# Patient Record
Sex: Female | Born: 1975 | Race: Black or African American | Hispanic: No | Marital: Single | Smoking: Never smoker
Health system: Southern US, Community
[De-identification: ages and names within clinical notes are randomized; demographics above are authoritative.]

## PROBLEM LIST (undated history)

## (undated) DIAGNOSIS — I10 Essential (primary) hypertension: Secondary | ICD-10-CM

## (undated) DIAGNOSIS — K219 Gastro-esophageal reflux disease without esophagitis: Secondary | ICD-10-CM

## (undated) DIAGNOSIS — E7800 Pure hypercholesterolemia, unspecified: Secondary | ICD-10-CM

## (undated) DIAGNOSIS — E119 Type 2 diabetes mellitus without complications: Secondary | ICD-10-CM

## (undated) DIAGNOSIS — M549 Dorsalgia, unspecified: Secondary | ICD-10-CM

## (undated) DIAGNOSIS — G8929 Other chronic pain: Secondary | ICD-10-CM

## (undated) HISTORY — PX: TONSILLECTOMY: SUR1361

## (undated) HISTORY — PX: ABDOMINAL HYSTERECTOMY: SHX81

## (undated) HISTORY — PX: CHOLECYSTECTOMY: SHX55

---

## 2013-04-16 ENCOUNTER — Emergency Department (HOSPITAL_COMMUNITY)
Admission: EM | Admit: 2013-04-16 | Discharge: 2013-04-16 | Disposition: A | Discharge status: Home / Self Care | Payer: Medicaid Other | Attending: Emergency Medicine | Admitting: Emergency Medicine

## 2013-04-16 ENCOUNTER — Encounter (HOSPITAL_COMMUNITY): Payer: Self-pay | Admitting: Emergency Medicine

## 2013-04-16 DIAGNOSIS — K3184 Gastroparesis: Secondary | ICD-10-CM | POA: Insufficient documentation

## 2013-04-16 DIAGNOSIS — G8929 Other chronic pain: Secondary | ICD-10-CM | POA: Insufficient documentation

## 2013-04-16 DIAGNOSIS — M545 Low back pain, unspecified: Secondary | ICD-10-CM | POA: Insufficient documentation

## 2013-04-16 DIAGNOSIS — E7800 Pure hypercholesterolemia, unspecified: Secondary | ICD-10-CM | POA: Insufficient documentation

## 2013-04-16 DIAGNOSIS — R109 Unspecified abdominal pain: Secondary | ICD-10-CM | POA: Insufficient documentation

## 2013-04-16 DIAGNOSIS — H9209 Otalgia, unspecified ear: Secondary | ICD-10-CM | POA: Insufficient documentation

## 2013-04-16 DIAGNOSIS — I10 Essential (primary) hypertension: Secondary | ICD-10-CM | POA: Insufficient documentation

## 2013-04-16 DIAGNOSIS — Z794 Long term (current) use of insulin: Secondary | ICD-10-CM | POA: Insufficient documentation

## 2013-04-16 DIAGNOSIS — K219 Gastro-esophageal reflux disease without esophagitis: Secondary | ICD-10-CM | POA: Insufficient documentation

## 2013-04-16 DIAGNOSIS — E119 Type 2 diabetes mellitus without complications: Secondary | ICD-10-CM | POA: Insufficient documentation

## 2013-04-16 DIAGNOSIS — M549 Dorsalgia, unspecified: Secondary | ICD-10-CM

## 2013-04-16 DIAGNOSIS — R110 Nausea: Secondary | ICD-10-CM | POA: Insufficient documentation

## 2013-04-16 HISTORY — DX: Type 2 diabetes mellitus without complications: E11.9

## 2013-04-16 HISTORY — DX: Dorsalgia, unspecified: M54.9

## 2013-04-16 HISTORY — DX: Other chronic pain: G89.29

## 2013-04-16 HISTORY — DX: Gastro-esophageal reflux disease without esophagitis: K21.9

## 2013-04-16 HISTORY — DX: Pure hypercholesterolemia, unspecified: E78.00

## 2013-04-16 HISTORY — DX: Essential (primary) hypertension: I10

## 2013-04-16 MED ORDER — DIPHENHYDRAMINE HCL 50 MG/ML IJ SOLN
25.0000 mg | Freq: Once | INTRAMUSCULAR | Status: AC
  Administered 2013-04-16: 25 mg via INTRAVENOUS
  Filled 2013-04-16: qty 1

## 2013-04-16 MED ORDER — LORAZEPAM 2 MG/ML IJ SOLN
1.0000 mg | Freq: Once | INTRAMUSCULAR | Status: AC
  Administered 2013-04-16: 1 mg via INTRAVENOUS
  Filled 2013-04-16: qty 1

## 2013-04-16 MED ORDER — SODIUM CHLORIDE 0.9 % IV SOLN
1000.0000 mL | INTRAVENOUS | Status: DC
  Administered 2013-04-16: 1000 mL via INTRAVENOUS

## 2013-04-16 MED ORDER — ONDANSETRON HCL 4 MG PO TABS: 4.0000 mg | ORAL_TABLET | Freq: Three times a day (TID) | ORAL | Status: AC | PRN

## 2013-04-16 MED ORDER — PREDNISONE 20 MG PO TABS
ORAL_TABLET | ORAL | Status: DC
Start: 2013-04-16 — End: 2015-05-09

## 2013-04-16 MED ORDER — METHOCARBAMOL 500 MG PO TABS: ORAL_TABLET | ORAL | Status: DC

## 2013-04-16 MED ORDER — FENTANYL CITRATE 0.05 MG/ML IJ SOLN
50.0000 ug | Freq: Once | INTRAMUSCULAR | Status: AC
  Administered 2013-04-16: 50 ug via INTRAVENOUS
  Filled 2013-04-16: qty 2

## 2013-04-16 MED ORDER — TRAMADOL HCL 50 MG PO TABS: 100.0000 mg | ORAL_TABLET | Freq: Four times a day (QID) | ORAL | Status: DC | PRN

## 2013-04-16 MED ORDER — METOCLOPRAMIDE HCL 10 MG PO TABS: 10.0000 mg | ORAL_TABLET | Freq: Four times a day (QID) | ORAL | Status: DC

## 2013-04-16 MED ORDER — KETOROLAC TROMETHAMINE 30 MG/ML IJ SOLN
30.0000 mg | Freq: Once | INTRAMUSCULAR | Status: AC
  Administered 2013-04-16: 30 mg via INTRAVENOUS
  Filled 2013-04-16: qty 1

## 2013-04-16 MED ORDER — METOCLOPRAMIDE HCL 5 MG/ML IJ SOLN
10.0000 mg | Freq: Once | INTRAMUSCULAR | Status: AC
  Administered 2013-04-16: 10 mg via INTRAVENOUS
  Filled 2013-04-16: qty 2

## 2013-04-16 MED ORDER — SODIUM CHLORIDE 0.9 % IV SOLN
1000.0000 mL | Freq: Once | INTRAVENOUS | Status: AC
Start: 2013-04-16 — End: 2013-04-16
  Administered 2013-04-16: 1000 mL via INTRAVENOUS

## 2013-04-16 MED ORDER — METHYLPREDNISOLONE SODIUM SUCC 125 MG IJ SOLR
125.0000 mg | Freq: Once | INTRAMUSCULAR | Status: AC
  Administered 2013-04-16: 125 mg via INTRAVENOUS
  Filled 2013-04-16: qty 2

## 2013-04-16 NOTE — ED Provider Notes (Addendum)
CSN: [id number]     Arrival date & time 04/16/13  4098 History   First MD Initiated Contact with Patient 04/16/13 [phone]     Chief Complaint  Patient presents with  . Back Pain  . Nausea  . Otalgia   (Consider location/radiation/quality/duration/timing/severity/associated sxs/prior Treatment) HPI Patient is here several different problems. She reports she has had back pain for the past 6 months. About one month ago she started seeing Dr. Marissa Nestle and has had a MRI showing a "slipped disc". She states he started her on medications and recommended outpatient physical therapy. She reports she has been unable to get the physical therapy done because she can't find somebody to take Medicaid. She states she was not given a list of facilities to call. She reports about 4 days ago her back started getting worse. She denies any specific injury 4 days ago. She called the office yesterday and they told her to followup with a physical therapist. She describes pain in her whole back from her neck down to her tailbone. She states the worst pain is in the upper back. She states her bilateral feet feel numb when she first gets up and then she wiggles them around and he gets better. She states laying down and walking for a period of time makes the pain worse. She states laying down and using heat or massage makes it feel better. She states her father is in our hospital having surgery so she came to the ED.   She also reports some chronic abdominal pain for which she had a hysterectomy done in June of 2014. She was told she had bad fibroids and her pain would get better after her hysterectomy. She reports however the pain is not improved. She has seen a GI doctor and is waiting for a referral at [facility] but states she's been waiting several months. She states she was told her  stomach empties slowly. She states her pain is diffuse. It's sharp it comes and goes and lasts about an hour. She denies dysuria but has frequency  now and then. She denies any fever. Nothing makes her abdominal pain worse, nothing makes it feel better.  PCP [facility] in [place] [facility] Dr Yevette Edwards  Past Medical History  Diagnosis Date  . Diabetes mellitus without complication   . Hypertension   . Hypercholesteremia   . GERD (gastroesophageal reflux disease)   . Chronic back pain    Past Surgical History  Procedure Laterality Date  . Abdominal hysterectomy    . Cholecystectomy    . Tonsillectomy     No family history on file. History  Substance Use Topics  . Smoking status: Never Smoker   . Smokeless tobacco: Not on file  . Alcohol Use: No   Unable to work for the past year  OB History   Grav Para Term Preterm Abortions TAB SAB Ect Mult Living                 Review of Systems  All other systems reviewed and are negative.    Allergies  Review of patient's allergies indicates no known allergies.  Home Medications   Current Outpatient Rx  Name  Route  Sig  Dispense  Refill  . insulin detemir (LEVEMIR) 100 UNIT/ML injection   Subcutaneous   Inject 30 Units into the skin daily.         . pantoprazole (PROTONIX) 40 MG tablet   Oral   Take 40 mg by mouth daily.         Marland Kitchen  simvastatin (ZOCOR) 20 MG tablet   Oral   Take 20 mg by mouth daily.          Flexeril mobic   BP 134/89  Pulse 98  Temp(Src) 97.5 F (36.4 C) (Oral)  Resp 20  SpO2 99%  Vital signs normal   Physical Exam  Nursing note and vitals reviewed. Constitutional: She is oriented to person, place, and time. She appears well-developed and well-nourished.  Non-toxic appearance. She does not appear ill. No distress.  Obese, crying  HENT:  Head: Normocephalic and atraumatic.  Right Ear: External ear normal.  Left Ear: External ear normal.  Nose: Nose normal. No mucosal edema or rhinorrhea.  Mouth/Throat: Oropharynx is clear and moist and mucous membranes are normal. No dental abscesses or uvula swelling.  Eyes:  Conjunctivae and EOM are normal. Pupils are equal, round, and reactive to light.  Neck: Normal range of motion and full passive range of motion without pain. Neck supple.  Cardiovascular: Normal rate, regular rhythm and normal heart sounds.  Exam reveals no gallop and no friction rub.   No murmur heard. Pulmonary/Chest: Effort normal and breath sounds normal. No respiratory distress. She has no wheezes. She has no rhonchi. She has no rales. She exhibits no tenderness and no crepitus.  Abdominal: Soft. Normal appearance and bowel sounds are normal. She exhibits no distension. There is no tenderness. There is no rebound and no guarding.  Musculoskeletal: Normal range of motion. She exhibits no edema and no tenderness.       Back:  Moves all extremities well. Gail normal. Patellar relexes +2 on the left +1 on the left. Has pain with ROM especially to the right but it causes pain in her upper thoracic spine.   Neurological: She is alert and oriented to person, place, and time. She has normal strength. No cranial nerve deficit.  Skin: Skin is warm, dry and intact. No rash noted. No erythema. No pallor.  Psychiatric: She has a normal mood and affect. Her speech is normal and behavior is normal. Her mood appears not anxious.    ED Course  Procedures (including critical care time)  Medications  0.9 %  sodium chloride infusion (0 mLs Intravenous Stopped 04/16/13 1153)    Followed by  0.9 %  sodium chloride infusion (0 mLs Intravenous Stopped 04/16/13 1325)  fentaNYL (SUBLIMAZE) injection 50 mcg (not administered)  LORazepam (ATIVAN) injection 1 mg (not administered)  metoCLOPramide (REGLAN) injection 10 mg (10 mg Intravenous Given 04/16/13 1057)  diphenhydrAMINE (BENADRYL) injection 25 mg (25 mg Intravenous Given 04/16/13 1101)  ketorolac (TORADOL) 30 MG/ML injection 30 mg (30 mg Intravenous Given 04/16/13 1058)  methylPREDNISolone sodium succinate (SOLU-MEDROL) 125 mg/2 mL injection 125 mg (125 mg  Intravenous Given 04/16/13 1055)  diphenhydrAMINE (BENADRYL) injection 25 mg (25 mg Intravenous Given 04/16/13 1245)      10:45 Social worker contacted to help get patient outpatient physical therapy resources.   Review of [place] shows patient had a gastric emptying test done in October 2014 which showed delayed emptying of her stomach. She had abd/pelvis CT scan done in November 2014 that did not show any acute findings. She had a MRI of her lumbar spine done on 03/10/2013. She was found to have a slightly transitional appearance of the L5 vertebral body. She had a L4-5 bulge which is shallow right posterior lateral slightly caudal extending protrusion with mild impression right aspect of the thecal sac and minimal crowding of the origin of the right L5 nerve  root. Mild facet joint degenerative changes were seen.  Pt given results of her prior studies and need to see her doctor this week to get the GI referral done [facility] (states has been waiting since September).  [place] Database reviewed. She has had a prescription since August. They are from  [place]icians [place] from [place] and [place]. Her last prescription was November 5 for #6 Vicodin. Her own PCP prescribed #60 tramadol in October. She has been on Vicodin, pentazocine/naloxone, oxycodone.  MRI not done today. Patient's worst pain is in her upper thoracic area rather than her lower back. She also has diffuse nonlocalizing pain of her whole back including the spine and paraspinous muscles. CT scan not done because this is patient's usual chronic pain.   No results found.   Imaging Review No results found.  EKG Interpretation   None       MDM   1. Chronic back pain greater than 3 months duration   2. Chronic abdominal pain   3. Gastroparesis    New Prescriptions   METHOCARBAMOL (ROBAXIN) 500 MG TABLET    Take 2 po QID   METOCLOPRAMIDE (REGLAN) 10 MG TABLET    Take 1 tablet (10 mg total) by mouth every 6 (six) hours.    ONDANSETRON (ZOFRAN) 4 MG TABLET    Take 1 tablet (4 mg total) by mouth every 8 (eight) hours as needed for nausea or vomiting.   PREDNISONE (DELTASONE) 20 MG TABLET    Take 3 po QD x 2d , then 2 po QD x 3d then 1 po QD x 3d   TRAMADOL (ULTRAM) 50 MG TABLET    Take 2 tablets (100 mg total) by mouth every 6 (six) hours as needed.    Plan discharge   Devoria AlbeIva Keziyah Kneale, MD, [facility]     Jasneet Schobert L Zeno Hickel, MD 04/16/13 1425  Ward GivensIva L Teala Daffron, MD 04/16/13 [phone]

## 2013-04-16 NOTE — Progress Notes (Signed)
Weekend CSW received referral for listing of medicaid physical therapy providers. CSW contacted Manuela Schwartz from PT, who did not have a listing of outpatient physical therapy providers, but stated that [facility] may accept Medicaid patients. CSW called [facility], but the office is closed until Monday. CSW also looked online for physical therapists in [place], where patient lives, that accept Medicaid insurance- no results. CSW met with patient and provided her with a listing of physical therapists in [place] to contact to inquire if they accept Medicaid, and also provided patient with brochure on [facility] services to follow up with. Patient did not have any further questions at this time.  Tilden Fossa, MSW, [place] Clinical Social Worker [facility] Dept. [phone]

## 2013-04-16 NOTE — ED Notes (Signed)
Pt reporting chronic back pain worse for 4 days. Also vomiting x 4 days and left ear pain. Pt is a x 4. Denies urinary s/s. No diarrhea.

## 2013-04-16 NOTE — Discharge Instructions (Signed)
Try ice and heat to your back. Take the medications as prescribed. Take the tramadol with acetaminophen 1000 mg 4 times a day. You need to see Dr Milas Gain this week to discuss your further evaluation by the stomach specialist. Call the physical therapy facilities given to you by the social worker to see about getting into physical therapy for your back. Start taking the reglan again, it will help your stomach empty better and should help with your pain.    Chronic Pain Discharge Instructions  Emergency care providers appreciate that many patients coming to [place] are in severe pain and we wish to address their pain in the safest, most responsible manner.  It is important to recognize however, that the proper treatment of chronic pain differs from that of the pain of injuries and acute illnesses.  Our goal is to provide quality, safe, personalized care and we thank you for giving [place] the opportunity to serve you. The use of narcotics and related agents for chronic pain syndromes may lead to additional physical and psychological problems.  Nearly as many people die from prescription narcotics each year as die from car crashes.  Additionally, this risk is increased if such prescriptions are obtained from a variety of sources.  Therefore, only your primary care physician or a pain management specialist is able to safely treat such syndromes with narcotic medications long-term.    Documentation revealing such prescriptions have been sought from multiple sources may prohibit [place] from providing a refill or different narcotic medication.  Your name may be checked first through the [facility] Controlled Substances Reporting System.  This database is a record of controlled substance medication prescriptions that the patient has received.  This has been established by [facility] in an effort to eliminate the dangerous, and often life threatening, practice of obtaining multiple prescriptions from different medical  providers.   If you have a chronic pain syndrome (i.e. chronic headaches, recurrent back or neck pain, dental pain, abdominal or pelvis pain without a specific diagnosis, or neuropathic pain such as fibromyalgia) or recurrent visits for the same condition without an acute diagnosis, you may be treated with non-narcotics and other non-addictive medicines.  Allergic reactions or negative side effects that may be reported by a patient to such medications will not typically lead to the use of a narcotic analgesic or other controlled substance as an alternative.   Patients managing chronic pain with a personal physician should have provisions in place for breakthrough pain.  If you are in crisis, you should call your physician.  If your physician directs you to the emergency department, please have the doctor call and speak to our attending physician concerning your care.   When patients come to the Emergency Department (ED) with acute medical conditions in which the Emergency Department physician feels appropriate to prescribe narcotic or sedating pain medication, the physician will prescribe these in very limited quantities.  The amount of these medications will last only until you can see your primary care physician in his/her office.  Any patient who returns to the ED seeking refills should expect only non-narcotic pain medications.   In the event of an acute medical condition exists and the emergency physician feels it is necessary that the patient be given a narcotic or sedating medication -  a responsible adult driver should be present in the room prior to the medication being given by the nurse.   Prescriptions for narcotic or sedating medications that have been lost, stolen or  expired will not be refilled in the Emergency Department.    Patients who have chronic pain may receive non-narcotic prescriptions until seen by their primary care physician.  It is every patients personal responsibility to  maintain active prescriptions with his or her primary care physician or specialist.

## 2015-05-09 ENCOUNTER — Inpatient Hospital Stay (HOSPITAL_COMMUNITY)
Admission: EM | Admit: 2015-05-09 | Discharge: 2015-05-14 | DRG: 287 | Disposition: A | Discharge status: Home / Self Care | Payer: Medicaid Other | Attending: Cardiology | Admitting: Cardiology

## 2015-05-09 ENCOUNTER — Encounter (HOSPITAL_COMMUNITY): Payer: Self-pay | Admitting: Family Medicine

## 2015-05-09 ENCOUNTER — Emergency Department (HOSPITAL_COMMUNITY): Payer: Medicaid Other

## 2015-05-09 DIAGNOSIS — E119 Type 2 diabetes mellitus without complications: Secondary | ICD-10-CM | POA: Diagnosis present

## 2015-05-09 DIAGNOSIS — R079 Chest pain, unspecified: Secondary | ICD-10-CM | POA: Diagnosis present

## 2015-05-09 DIAGNOSIS — Z23 Encounter for immunization: Secondary | ICD-10-CM

## 2015-05-09 DIAGNOSIS — Z6841 Body Mass Index (BMI) 40.0 and over, adult: Secondary | ICD-10-CM

## 2015-05-09 DIAGNOSIS — Z7901 Long term (current) use of anticoagulants: Secondary | ICD-10-CM

## 2015-05-09 DIAGNOSIS — Z794 Long term (current) use of insulin: Secondary | ICD-10-CM

## 2015-05-09 DIAGNOSIS — R0602 Shortness of breath: Secondary | ICD-10-CM

## 2015-05-09 DIAGNOSIS — Z8249 Family history of ischemic heart disease and other diseases of the circulatory system: Secondary | ICD-10-CM

## 2015-05-09 DIAGNOSIS — R072 Precordial pain: Principal | ICD-10-CM | POA: Diagnosis present

## 2015-05-09 DIAGNOSIS — Z86711 Personal history of pulmonary embolism: Secondary | ICD-10-CM

## 2015-05-09 DIAGNOSIS — K219 Gastro-esophageal reflux disease without esophagitis: Secondary | ICD-10-CM | POA: Diagnosis present

## 2015-05-09 DIAGNOSIS — R109 Unspecified abdominal pain: Secondary | ICD-10-CM

## 2015-05-09 DIAGNOSIS — E7800 Pure hypercholesterolemia, unspecified: Secondary | ICD-10-CM | POA: Diagnosis present

## 2015-05-09 DIAGNOSIS — I10 Essential (primary) hypertension: Secondary | ICD-10-CM | POA: Diagnosis present

## 2015-05-09 DIAGNOSIS — G8929 Other chronic pain: Secondary | ICD-10-CM | POA: Diagnosis present

## 2015-05-09 DIAGNOSIS — M549 Dorsalgia, unspecified: Secondary | ICD-10-CM | POA: Diagnosis present

## 2015-05-09 DIAGNOSIS — R1084 Generalized abdominal pain: Secondary | ICD-10-CM | POA: Diagnosis present

## 2015-05-09 DIAGNOSIS — R931 Abnormal findings on diagnostic imaging of heart and coronary circulation: Secondary | ICD-10-CM | POA: Diagnosis present

## 2015-05-09 DIAGNOSIS — R112 Nausea with vomiting, unspecified: Secondary | ICD-10-CM | POA: Diagnosis present

## 2015-05-09 LAB — COMPREHENSIVE METABOLIC PANEL
ALBUMIN: 3.6 g/dL (ref 3.5–5.0)
ALK PHOS: 68 U/L (ref 38–126)
ALT: 22 U/L (ref 14–54)
AST: 15 U/L (ref 15–41)
Anion gap: 13 (ref 5–15)
BILIRUBIN TOTAL: 1 mg/dL (ref 0.3–1.2)
BUN: 9 mg/dL (ref 6–20)
CALCIUM: 9.5 mg/dL (ref 8.9–10.3)
CO2: 27 mmol/L (ref 22–32)
CREATININE: 0.66 mg/dL (ref 0.44–1.00)
Chloride: 100 mmol/L — ABNORMAL LOW (ref 101–111)
GFR calc Af Amer: >60 mL/min (ref >60)
GFR calc non Af Amer: >60 mL/min (ref >60)
GLUCOSE: 214 mg/dL — AB (ref 65–99)
Potassium: 4 mmol/L (ref 3.5–5.1)
Sodium: 140 mmol/L (ref 135–145)
TOTAL PROTEIN: 7.2 g/dL (ref 6.5–8.1)

## 2015-05-09 LAB — BASIC METABOLIC PANEL
ANION GAP: 15 (ref 5–15)
BUN: 10 mg/dL (ref 6–20)
CALCIUM: 10 mg/dL (ref 8.9–10.3)
CO2: 27 mmol/L (ref 22–32)
Chloride: 96 mmol/L — ABNORMAL LOW (ref 101–111)
Creatinine, Ser: 0.74 mg/dL (ref 0.44–1.00)
GFR calc Af Amer: >60 mL/min (ref >60)
Glucose, Bld: 250 mg/dL — ABNORMAL HIGH (ref 65–99)
Potassium: 4.2 mmol/L (ref 3.5–5.1)
Sodium: 138 mmol/L (ref 135–145)

## 2015-05-09 LAB — CBC
HCT: 41 % (ref 36.0–46.0)
HEMOGLOBIN: 14 g/dL (ref 12.0–15.0)
MCH: 29.2 pg (ref 26.0–34.0)
MCHC: 34.1 g/dL (ref 30.0–36.0)
MCV: 85.6 fL (ref 78.0–100.0)
Platelets: 398 10*3/uL (ref 150–400)
RBC: 4.79 MIL/uL (ref 3.87–5.11)
RDW: 12.4 % (ref 11.5–15.5)
WBC: 8.4 10*3/uL (ref 4.0–10.5)

## 2015-05-09 LAB — CBC WITH DIFFERENTIAL/PLATELET
BASOS ABS: 0 10*3/uL (ref 0.0–0.1)
BASOS PCT: 1 %
EOS PCT: 1 %
Eosinophils Absolute: 0.1 10*3/uL (ref 0.0–0.7)
HCT: 38.3 % (ref 36.0–46.0)
Hemoglobin: 13.2 g/dL (ref 12.0–15.0)
Lymphocytes Relative: 26 %
Lymphs Abs: 2.2 10*3/uL (ref 0.7–4.0)
MCH: 29.5 pg (ref 26.0–34.0)
MCHC: 34.5 g/dL (ref 30.0–36.0)
MCV: 85.7 fL (ref 78.0–100.0)
MONO ABS: 0.5 10*3/uL (ref 0.1–1.0)
Monocytes Relative: 6 %
Neutro Abs: 5.5 10*3/uL (ref 1.7–7.7)
Neutrophils Relative %: 66 %
PLATELETS: 417 10*3/uL — AB (ref 150–400)
RBC: 4.47 MIL/uL (ref 3.87–5.11)
RDW: 12.6 % (ref 11.5–15.5)
WBC: 8.3 10*3/uL (ref 4.0–10.5)

## 2015-05-09 LAB — LIPASE, BLOOD: Lipase: 30 U/L (ref 11–51)

## 2015-05-09 LAB — I-STAT BETA HCG BLOOD, ED (MC, WL, AP ONLY): I-stat hCG, quantitative: <5 m[IU]/mL (ref <5)

## 2015-05-09 LAB — GLUCOSE, CAPILLARY: Glucose-Capillary: 202 mg/dL — ABNORMAL HIGH (ref 65–99)

## 2015-05-09 LAB — I-STAT TROPONIN, ED
Troponin i, poc: 0 ng/mL (ref 0.00–0.08)
Troponin i, poc: 0.01 ng/mL (ref 0.00–0.08)

## 2015-05-09 LAB — APTT: aPTT: 28 seconds (ref 24–37)

## 2015-05-09 LAB — HEPARIN LEVEL (UNFRACTIONATED): Heparin Unfractionated: <0.1 IU/mL — ABNORMAL LOW (ref 0.30–0.70)

## 2015-05-09 LAB — TROPONIN I: Troponin I: <0.03 ng/mL (ref <0.031)

## 2015-05-09 LAB — TSH: TSH: 0.781 u[IU]/mL (ref 0.350–4.500)

## 2015-05-09 LAB — MAGNESIUM: MAGNESIUM: 2 mg/dL (ref 1.7–2.4)

## 2015-05-09 MED ORDER — SODIUM CHLORIDE 0.9 % IV BOLUS (SEPSIS)
1000.0000 mL | Freq: Once | INTRAVENOUS | Status: AC
Start: 2015-05-09 — End: 2015-05-09
  Administered 2015-05-09: 1000 mL via INTRAVENOUS

## 2015-05-09 MED ORDER — GI COCKTAIL ~~LOC~~
30.0000 mL | Freq: Once | ORAL | Status: AC
  Administered 2015-05-09: 30 mL via ORAL
  Filled 2015-05-09: qty 30

## 2015-05-09 MED ORDER — IOHEXOL 350 MG/ML SOLN
50.0000 mL | Freq: Once | INTRAVENOUS | Status: AC | PRN
  Administered 2015-05-09: 50 mL via INTRAVENOUS

## 2015-05-09 MED ORDER — SODIUM CHLORIDE 0.9 % IV SOLN
INTRAVENOUS | Status: DC
  Administered 2015-05-09: 22:00:00 via INTRAVENOUS

## 2015-05-09 MED ORDER — HEPARIN BOLUS VIA INFUSION
3000.0000 [IU] | Freq: Once | INTRAVENOUS | Status: AC
  Administered 2015-05-10: 3000 [IU] via INTRAVENOUS
  Filled 2015-05-09: qty 3000

## 2015-05-09 MED ORDER — SIMVASTATIN 20 MG PO TABS
20.0000 mg | ORAL_TABLET | Freq: Every day | ORAL | Status: DC
Start: 2015-05-10 — End: 2015-05-14
  Administered 2015-05-10 – 2015-05-14 (×5): 20 mg via ORAL
  Filled 2015-05-09 (×5): qty 1

## 2015-05-09 MED ORDER — ASPIRIN 300 MG RE SUPP: 300.0000 mg | RECTAL | Status: AC

## 2015-05-09 MED ORDER — ONDANSETRON HCL 4 MG PO TABS
4.0000 mg | ORAL_TABLET | Freq: Three times a day (TID) | ORAL | Status: DC | PRN
  Administered 2015-05-10 – 2015-05-13 (×2): 4 mg via ORAL
  Filled 2015-05-09: qty 1

## 2015-05-09 MED ORDER — HEPARIN (PORCINE) IN NACL 100-0.45 UNIT/ML-% IJ SOLN
2300.0000 [IU]/h | INTRAMUSCULAR | Status: DC
  Administered 2015-05-10: 1800 [IU]/h via INTRAVENOUS
  Administered 2015-05-10: 2100 [IU]/h via INTRAVENOUS
  Administered 2015-05-11 – 2015-05-14 (×6): 2300 [IU]/h via INTRAVENOUS
  Filled 2015-05-09 (×9): qty 250

## 2015-05-09 MED ORDER — NITROGLYCERIN 2 % TD OINT
0.5000 [in_us] | TOPICAL_OINTMENT | Freq: Four times a day (QID) | TRANSDERMAL | Status: DC
  Administered 2015-05-09 – 2015-05-14 (×13): 0.5 [in_us] via TOPICAL
  Filled 2015-05-09: qty 30

## 2015-05-09 MED ORDER — INSULIN DETEMIR 100 UNIT/ML ~~LOC~~ SOLN
70.0000 [IU] | Freq: Every day | SUBCUTANEOUS | Status: DC
  Administered 2015-05-10 – 2015-05-14 (×5): 70 [IU] via SUBCUTANEOUS
  Filled 2015-05-09 (×5): qty 0.7

## 2015-05-09 MED ORDER — PNEUMOCOCCAL VAC POLYVALENT 25 MCG/0.5ML IJ INJ
0.5000 mL | INJECTION | INTRAMUSCULAR | Status: AC
  Administered 2015-05-10: 0.5 mL via INTRAMUSCULAR
  Filled 2015-05-09: qty 0.5

## 2015-05-09 MED ORDER — PANTOPRAZOLE SODIUM 40 MG PO TBEC
40.0000 mg | DELAYED_RELEASE_TABLET | Freq: Every day | ORAL | Status: DC
  Administered 2015-05-10 – 2015-05-14 (×5): 40 mg via ORAL
  Filled 2015-05-09 (×5): qty 1

## 2015-05-09 MED ORDER — ONDANSETRON HCL 4 MG/2ML IJ SOLN
4.0000 mg | Freq: Once | INTRAMUSCULAR | Status: AC | PRN
  Administered 2015-05-09: 4 mg via INTRAVENOUS
  Filled 2015-05-09 (×2): qty 2

## 2015-05-09 MED ORDER — MORPHINE SULFATE (PF) 2 MG/ML IV SOLN
2.0000 mg | Freq: Once | INTRAVENOUS | Status: AC
  Administered 2015-05-09: 2 mg via INTRAVENOUS
  Filled 2015-05-09: qty 1

## 2015-05-09 MED ORDER — NITROGLYCERIN 0.4 MG SL SUBL
0.4000 mg | SUBLINGUAL_TABLET | SUBLINGUAL | Status: DC | PRN
  Administered 2015-05-12 – 2015-05-13 (×6): 0.4 mg via SUBLINGUAL
  Filled 2015-05-09 (×3): qty 1

## 2015-05-09 MED ORDER — MORPHINE SULFATE (PF) 4 MG/ML IV SOLN
4.0000 mg | Freq: Once | INTRAVENOUS | Status: AC
  Administered 2015-05-09: 4 mg via INTRAVENOUS
  Filled 2015-05-09: qty 1

## 2015-05-09 MED ORDER — ASPIRIN EC 81 MG PO TBEC
81.0000 mg | DELAYED_RELEASE_TABLET | Freq: Every day | ORAL | Status: DC
  Administered 2015-05-10 – 2015-05-13 (×4): 81 mg via ORAL
  Filled 2015-05-09 (×5): qty 1

## 2015-05-09 MED ORDER — INSULIN ASPART 100 UNIT/ML ~~LOC~~ SOLN
0.0000 [IU] | Freq: Three times a day (TID) | SUBCUTANEOUS | Status: DC
  Administered 2015-05-10: 3 [IU] via SUBCUTANEOUS
  Administered 2015-05-10 (×2): 2 [IU] via SUBCUTANEOUS
  Administered 2015-05-11 (×2): 1 [IU] via SUBCUTANEOUS
  Administered 2015-05-11 – 2015-05-12 (×2): 3 [IU] via SUBCUTANEOUS
  Administered 2015-05-12: 2 [IU] via SUBCUTANEOUS
  Administered 2015-05-12 – 2015-05-13 (×2): 1 [IU] via SUBCUTANEOUS
  Administered 2015-05-13 (×2): 2 [IU] via SUBCUTANEOUS

## 2015-05-09 MED ORDER — PROMETHAZINE HCL 25 MG/ML IJ SOLN
12.5000 mg | Freq: Three times a day (TID) | INTRAMUSCULAR | Status: DC | PRN
  Administered 2015-05-09 – 2015-05-14 (×6): 12.5 mg via INTRAVENOUS
  Filled 2015-05-09 (×7): qty 1

## 2015-05-09 MED ORDER — ALUM & MAG HYDROXIDE-SIMETH 200-200-20 MG/5ML PO SUSP
30.0000 mL | Freq: Four times a day (QID) | ORAL | Status: DC | PRN
  Administered 2015-05-09: 30 mL via ORAL
  Filled 2015-05-09: qty 30

## 2015-05-09 MED ORDER — ASPIRIN 81 MG PO CHEW
324.0000 mg | CHEWABLE_TABLET | ORAL | Status: AC
  Administered 2015-05-09: 324 mg via ORAL
  Filled 2015-05-09: qty 4

## 2015-05-09 MED ORDER — LISINOPRIL 40 MG PO TABS
40.0000 mg | ORAL_TABLET | Freq: Every day | ORAL | Status: DC
  Administered 2015-05-10 – 2015-05-14 (×5): 40 mg via ORAL
  Filled 2015-05-09 (×5): qty 1

## 2015-05-09 MED ORDER — METOPROLOL TARTRATE 50 MG PO TABS
50.0000 mg | ORAL_TABLET | Freq: Two times a day (BID) | ORAL | Status: DC
  Administered 2015-05-09 – 2015-05-14 (×9): 50 mg via ORAL
  Filled 2015-05-09 (×3): qty 1
  Filled 2015-05-09: qty 2
  Filled 2015-05-09: qty 1
  Filled 2015-05-09: qty 2
  Filled 2015-05-09 (×5): qty 1

## 2015-05-09 MED ORDER — ONDANSETRON HCL 4 MG/2ML IJ SOLN
4.0000 mg | Freq: Four times a day (QID) | INTRAMUSCULAR | Status: DC | PRN
  Administered 2015-05-09 – 2015-05-14 (×11): 4 mg via INTRAVENOUS
  Filled 2015-05-09 (×12): qty 2

## 2015-05-09 MED ORDER — NITROGLYCERIN 0.4 MG SL SUBL
0.4000 mg | SUBLINGUAL_TABLET | Freq: Once | SUBLINGUAL | Status: AC
  Administered 2015-05-09: 0.4 mg via SUBLINGUAL
  Filled 2015-05-09: qty 1

## 2015-05-09 MED ORDER — OXYCODONE-ACETAMINOPHEN 5-325 MG PO TABS
1.0000 | ORAL_TABLET | Freq: Four times a day (QID) | ORAL | Status: DC | PRN
  Administered 2015-05-09 – 2015-05-10 (×2): 2 via ORAL
  Filled 2015-05-09 (×2): qty 2

## 2015-05-09 MED ORDER — ACETAMINOPHEN 325 MG PO TABS
650.0000 mg | ORAL_TABLET | ORAL | Status: DC | PRN
  Administered 2015-05-11 – 2015-05-14 (×4): 650 mg via ORAL
  Filled 2015-05-09 (×6): qty 2

## 2015-05-09 MED ORDER — ACETAMINOPHEN 500 MG PO TABS
1000.0000 mg | ORAL_TABLET | Freq: Once | ORAL | Status: AC
  Administered 2015-05-09: 1000 mg via ORAL
  Filled 2015-05-09: qty 2

## 2015-05-09 MED ORDER — MORPHINE SULFATE (PF) 2 MG/ML IV SOLN
2.0000 mg | Freq: Four times a day (QID) | INTRAVENOUS | Status: DC | PRN
  Administered 2015-05-09: 2 mg via INTRAVENOUS
  Filled 2015-05-09: qty 1

## 2015-05-09 NOTE — ED Notes (Signed)
Pt here for chest pain that started yesterday. sts constant. Pt crying. sts some nausea. sts coughing.

## 2015-05-09 NOTE — ED Provider Notes (Signed)
CSN: [id number]     Arrival date & time 05/09/15  1342 History   First MD Initiated Contact with Patient 05/09/15 1717     Chief Complaint  Patient presents with  . Chest Pain   History provided by patient.  (Consider location/radiation/quality/duration/timing/severity/associated sxs/prior Treatment) HPI  Patient reports symptoms of Left sided chest pain started suddenly around 1200, described as "burning and tightness" with some radiation down Left arm to elbow and to top of back / left shoulder, associated with shortness of breath, feeling warm and sweating, nauseas followed by a few episodes of vomiting (daughters reported some small amount of blood otherwise mostly clear), stated the chest pain was constant for about 2 hours, when arrived in ED (not EMS, driven by family) stated that chest pain "eased up" to 7/10 for a while then pain intensified again. Still active chest pain 9/10. Additionally with generalized abdominal pain. Active episode of vomiting 400cc in ED, mostly clear yellow appearance without any blood. - Denies any known history of prior MI. Concern with family history of MI in both parents, early in Mother < age 65. Known h/o HTN, morbid obesity, HLD. Does not take daily ASA. Took her meds today including basal insulin. - Admits headache - Denies any leg swelling or pain, lightheadedness, fatigue, dizziness, fevers/chills  Significant recent history, diagnosed with new acute onset PE about 3 months ago at [facility] (outside hospital), states she had a Chest CTA at that time, unclear if provoked PE but did have fall prior 3 weeks, otherwise no immobilization, no new medications, non-smoker, no OCPs, no prior DVTs. No family history of clot. No malignancy. She was treated with Eliquis, stated to take for total 6 months, will be done in 07/2015.  Past Medical History  Diagnosis Date  . Diabetes mellitus without complication (HCC)   . Hypertension   . Hypercholesteremia    . GERD (gastroesophageal reflux disease)   . Chronic back pain    Past Surgical History  Procedure Laterality Date  . Abdominal hysterectomy    . Cholecystectomy    . Tonsillectomy     History reviewed. No pertinent family history. Social History  Substance Use Topics  . Smoking status: Never Smoker   . Smokeless tobacco: None  . Alcohol Use: No   OB History    No data available     Review of Systems  See above HPI  Allergies  Review of patient's allergies indicates no known allergies.  Home Medications   Prior to Admission medications   Medication Sig Start Date End Date Taking? Authorizing Provider  Apixaban (ELIQUIS PO) Take 1 tablet by mouth 2 (two) times daily.   Yes Historical Provider, MD  insulin detemir (LEVEMIR) 100 UNIT/ML injection Inject 70 Units into the skin daily. Patient states she is taking 70 units now. Per patient   Yes Historical Provider, MD  lisinopril (PRINIVIL,ZESTRIL) 40 MG tablet Take 40 mg by mouth daily.   Yes Historical Provider, MD  ondansetron (ZOFRAN) 4 MG tablet Take 1 tablet (4 mg total) by mouth every 8 (eight) hours as needed for nausea or vomiting. 04/16/13  Yes Devoria Albe, MD  pantoprazole (PROTONIX) 40 MG tablet Take 40 mg by mouth daily.   Yes Historical Provider, MD  simvastatin (ZOCOR) 20 MG tablet Take 20 mg by mouth daily.   Yes Historical Provider, MD  traMADol (ULTRAM) 50 MG tablet Take 2 tablets (100 mg total) by mouth every 6 (six) hours as needed. Patient taking differently:  Take 100 mg by mouth 3 (three) times daily.  04/16/13  Yes Devoria Albe, MD  methocarbamol (ROBAXIN) 500 MG tablet Take 2 po QID Patient not taking: Reported on 05/09/2015 04/16/13   Devoria Albe, MD  metoCLOPramide (REGLAN) 10 MG tablet Take 1 tablet (10 mg total) by mouth every 6 (six) hours. Patient not taking: Reported on 05/09/2015 04/16/13   Devoria Albe, MD  predniSONE (DELTASONE) 20 MG tablet Take 3 po QD x 2d , then 2 po QD x 3d then 1 po QD x 3d Patient not  taking: Reported on 05/09/2015 04/16/13   Devoria Albe, MD   BP 155/100 mmHg  Pulse 113  Temp(Src) 98.5 F (36.9 C) (Oral)  Resp 13  Wt 146.512 kg  SpO2 98% Physical Exam  Constitutional: She is oriented to person, place, and time. She appears well-developed and well-nourished. She appears distressed.  Morbidly obese 39 AAF, laying in bed, uncomfortable, shifting back and forth with back pain, occasionally tearful with chest pain  HENT:  Head: Normocephalic and atraumatic.  Mouth/Throat: Oropharynx is clear and moist. No oropharyngeal exudate.  Eyes: Conjunctivae and EOM are normal. Pupils are equal, round, and reactive to light.  Neck: Normal range of motion. Neck supple. No thyromegaly present.  Cardiovascular: Regular rhythm, normal heart sounds and intact distal pulses.   No murmur heard. Tachycardic  Pulmonary/Chest: Effort normal and breath sounds normal. No respiratory distress. She has no wheezes. She has no rales. She exhibits tenderness (mid-sternal and left lower chest wall).  Abdominal: Soft. Bowel sounds are normal. She exhibits no distension and no mass. There is tenderness (generalized bilateral lower abdominal pain to mild palpation). There is no rebound and no guarding.  Musculoskeletal: Normal range of motion. She exhibits no edema.  Distal extremity str 5/5 grip and ankles  Lymphadenopathy:    She has no cervical adenopathy.  Neurological: She is alert and oriented to person, place, and time.  Skin: Skin is warm and dry. No rash noted. She is not diaphoretic.  Psychiatric: She has a normal mood and affect. Her behavior is normal.  Nursing note and vitals reviewed.   ED Course  Procedures (including critical care time) Labs Review Labs Reviewed  BASIC METABOLIC PANEL - Abnormal; Notable for the following:    Chloride 96 (*)    Glucose, Bld 250 (*)    All other components within normal limits  CBC  LIPASE, BLOOD  URINALYSIS, ROUTINE W REFLEX MICROSCOPIC (NOT AT  [facility])  I-STAT TROPOININ, ED  I-STAT BETA HCG BLOOD, ED ([facility], [facility], [facility] ONLY)  I-STAT TROPOININ, ED    Imaging Review Dg Chest 2 View  05/09/2015  CLINICAL DATA:  Mid chest pain.  Nausea vomiting. EXAM: CHEST  2 VIEW COMPARISON:  04/10/2015.  02/27/2015. FINDINGS: The heart size and mediastinal contours are within normal limits. Both lungs are clear. The visualized skeletal structures are unremarkable. IMPRESSION: No active cardiopulmonary disease. Electronically Signed   By: Maisie Fus  Register   On: 05/09/2015 14:10   Ct Angio Chest Pe W/cm &/or Wo Cm  05/09/2015  CLINICAL DATA:  Mid to left-sided chest pain beginning today. Was treated for pulmonary embolus 3 months ago at outside facility. Currently taking Eliquis. EXAM: CT ANGIOGRAPHY CHEST WITH CONTRAST TECHNIQUE: Multidetector CT imaging of the chest was performed using the standard protocol during bolus administration of intravenous contrast. Multiplanar CT image reconstructions and MIPs were obtained to evaluate the vascular anatomy. CONTRAST:  50mL OMNIPAQUE IOHEXOL 350 MG/ML SOLN, 50mL OMNIPAQUE IOHEXOL  350 MG/ML SOLN COMPARISON:  Chest CT angiograms dated 03/25/2015 and 02/18/2015. FINDINGS: Mediastinum/Lymph Nodes: Evaluation of the peripheral segmental and subsegmental pulmonary arteries is limited by patient body habitus and patient breathing motion artifact. There is no central pulmonary embolism identified within the main, lobar or central segmental pulmonary arteries. I cannot exclude a small peripheral pulmonary embolism on this exam. Thoracic aorta is normal in caliber and configuration. No aortic aneurysm or dissection. Heart size is normal. No pericardial effusion. No mass or enlarged lymph nodes within the mediastinum or perihilar regions. Trachea and central bronchi are unremarkable. Lungs/Pleura: Lungs are clear. No pulmonary mass, infiltrate, or effusion. No pneumothorax. Upper abdomen: Limited images of the upper abdomen are unremarkable.  Patient is status post cholecystectomy. Musculoskeletal: Superficial soft tissues are unremarkable. Old healed rib fractures noted bilaterally. No acute osseous abnormality seen. Review of the MIP images confirms the above findings. IMPRESSION: 1. No central obstructing pulmonary embolism seen. Due to patient body habitus and fairly prominent breathing motion artifact, I cannot exclude a small peripheral pulmonary embolism. 2. Heart size is normal.  No pericardial effusion. 3. No aortic aneurysm or dissection. 4. Lungs are clear. Electronically Signed   By: Bary Richard M.D.   On: 05/09/2015 19:07   I have personally reviewed and evaluated these images and lab results as part of my medical decision-making.   EKG Interpretation   Date/Time:  Wednesday May 09 2015 18:33:40 EST Ventricular Rate:  115 PR Interval:  152 QRS Duration: 85 QT Interval:  329 QTC Calculation: 455 R Axis:   -113 Text Interpretation:  Sinus tachycardia Right superior axis Baseline  wander in lead(s) I III aVL No significant change since last tracing  Confirmed by BEATON  MD, ROBERT ([id number]) on 05/09/2015 7:30:56 PM      MDM   Final diagnoses:  Chest pain, unspecified chest pain type  Shortness of breath  History of pulmonary embolus (PE)   39 yr AAF female PMH morbid obesity, DM2, HTN, HLD, recent PE x 3 months ago on anticoag with Eliquis x 6 months (admits good adherence, no missed doses), presents with acute onset left chest pain with associated diaphoresis n/v SOB, not exertional, persistent / constant pain concerning for potential ACS, patient with significant cardiac risk factors (early fam h/o MI), initial work-up in triage with BMET, CBC, i-stat trop, CXR, EKG. Differential includes concern for repeat / new PE (sinus tachy, similar symptoms to prior, however seems adherent to eliquis), additionally consider GERD / GI with n/v and abdominal pain. Hemodynamically with elevated BP and tachy.  UPDATE 1800 -  HEART Score 4 (suspicious history). Still persistent chest pain, patient is tearful, no significant nausea at this time. Repeat EKG, repeat delta i-stat troponin (last 0.00 about 3 hrs ago), Morphine  IV x 1 and NTG SL x 1 for chest pain. Ordered 1L NS bolus. Discussed case with Dr Radford Pax earlier and contacted radiology, agree with pursuing repeat chest CTA for re-evaluation of prior PE.  UPDATE 1854 Patient returned from Chest CTA (pending results). Still active 10/10 chest pain, unchanged from prior. No relief from Morphine  IV and GI cocktail. Ordered NTG SL x 1. Repeat delta i-stat trop 0.00 to 0.01, repeat EKG still sinus tachy without any significant ST-T ischemic changes.  UPDATE 1920 - Reviewed results of Chest CTA (No evidence of obstructing main PE, however cannot exclude smaller peripheral PE with motion artifact and large body habitus, this was compared to prior Chest CTA 03/2015 with prior  diagnosed PE). Patient still very uncomfortable, now has worsening headache from NTG x 1, BP stable to hypertensive. Ordered Morphine IV 4mg  x 1 and Tylenol 1g. Called ED consult to Cardiology for further recommendations.  UPDATE 88 - Discussed case with Cardiology - Dr Sharyn Lull. Given concerning history, persistent CP, and HEART score 4, he agrees patient meets admission criteria.  UPDATE 2012 - Dr Sharyn Lull Cardiology to admit patient for further work-up.    Smitty Cords, DO 05/09/15 2014  Nelva Nay, MD 05/09/15 2016

## 2015-05-09 NOTE — ED Notes (Signed)
Attempted to obtain urine sample from pt. Pt stated she is unable to provide sample at this time. Will try again later

## 2015-05-09 NOTE — Progress Notes (Addendum)
ANTICOAGULATION CONSULT NOTE - Initial Consult  Pharmacy Consult for heparin Indication: CP and h/o PE  No Known Allergies  Patient Measurements: Height:  (172.7 cm) Weight: (!) 335 lb 8.6 oz (152.2 kg) IBW/kg (Calculated) : 63.9 Heparin Dosing Weight: 100kg  Vital Signs: Temp: 98.3 F (36.8 C) (02/08 2059) Temp Source: Oral (02/08 2059) BP: 172/108 mmHg (02/08 2059) Pulse Rate: 112 (02/08 2059)  Labs:  Recent Labs  05/09/15 1356 05/09/15 2206  HGB 14.0 13.2  HCT 41.0 38.3  PLT 398 417*  APTT  --  28  CREATININE 0.74 0.66  TROPONINI  --  <0.03    Estimated Creatinine Clearance: 144.9 mL/min (by C-G formula based on Cr of 0.66).   Medical History: Past Medical History  Diagnosis Date  . Diabetes mellitus without complication (HCC)   . Hypertension   . Hypercholesteremia   . GERD (gastroesophageal reflux disease)   . Chronic back pain     Medications:  Prescriptions prior to admission  Medication Sig Dispense Refill Last Dose  . Apixaban (ELIQUIS PO) Take 1 tablet by mouth 2 (two) times daily.   05/09/2015 at 0800  . insulin detemir (LEVEMIR) 100 UNIT/ML injection Inject 70 Units into the skin daily. Patient states she is taking 70 units now. Per patient   05/09/2015 at Unknown time  . lisinopril (PRINIVIL,ZESTRIL) 40 MG tablet Take 40 mg by mouth daily.   05/09/2015 at Unknown time  . ondansetron (ZOFRAN) 4 MG tablet Take 1 tablet (4 mg total) by mouth every 8 (eight) hours as needed for nausea or vomiting. 20 tablet 0 05/09/2015 at Unknown time  . pantoprazole (PROTONIX) 40 MG tablet Take 40 mg by mouth daily.   05/09/2015 at Unknown time  . simvastatin (ZOCOR) 20 MG tablet Take 20 mg by mouth daily.   05/09/2015 at Unknown time  . traMADol (ULTRAM) 50 MG tablet Take 2 tablets (100 mg total) by mouth every 6 (six) hours as needed. (Patient taking differently: Take 100 mg by mouth 3 (three) times daily. ) 16 tablet 0 05/09/2015 at Unknown time   Scheduled:  . [START  ON 05/10/2015] aspirin EC  81 mg Oral Daily  . [START ON 05/10/2015] insulin aspart  0-9 Units Subcutaneous TID WC  . [START ON 05/10/2015] insulin detemir  70 Units Subcutaneous Daily  . [START ON 05/10/2015] lisinopril  40 mg Oral Daily  . metoprolol tartrate  50 mg Oral BID  . [START ON 05/10/2015] nitroGLYCERIN  0.5 inch Topical 4 times per day  . [START ON 05/10/2015] pantoprazole  40 mg Oral Daily  . [START ON 05/10/2015] pneumococcal 23 valent vaccine  0.5 mL Intramuscular Tomorrow-1000  . [START ON 05/10/2015] simvastatin  20 mg Oral Daily   Infusions:  . sodium chloride 10 mL/hr at 05/09/15 2150    Assessment: 39yo female c/o CP since yesterday associated w/ some nausea, on Eliquis PTA for h/o PE, to transition to heparin while ruling out ACS.  Goal of Therapy:  Heparin level 0.3-0.7 units/ml aPTT 66-102 seconds Monitor platelets by anticoagulation protocol: Yes   Plan:  Will give heparin bolus of 3000 units followed by gtt at 1800 units/hr and monitor heparin levels and CBC.  Vernard Gambles, PharmD, BCPS  05/09/2015,11:11 PM

## 2015-05-09 NOTE — H&P (Signed)
[name] is an 40 y.o. female.   Chief Complaint: Chest pain HPI: Patient is 40 year old female with past medical history significant for hypertension, type 2 diabetes mellitus, hyperlipidemia, history of pulmonary embolism approximately 3 months ago, morbid obesity, positive family history of coronary artery disease came to the ER complaining of left-sided chest pain radiating to left shoulder and back associated with nausea vomiting and mild shortness of breath. Describes chest pain as tightness and burning in nature. Also complains of abdominal pain. Denies relation of chest pain to food breathing or movement or positional pain. Denies any recent flulike symptoms. EKG done in the ED showed normal sinus rhythm with no acute ischemic changes facet of troponin I is negative. Patient states she had a stress test many years ago at [facility] which was negative. Patient also had CT end of the chest today which showed no evidence of PE or dissection.  Past Medical History  Diagnosis Date  . Diabetes mellitus without complication ([place])   . Hypertension   . Hypercholesteremia   . GERD (gastroesophageal reflux disease)   . Chronic back pain     Past Surgical History  Procedure Laterality Date  . Abdominal hysterectomy    . Cholecystectomy    . Tonsillectomy      History reviewed. No pertinent family history. Social History:  reports that she has never smoked. She does not have any smokeless tobacco history on file. She reports that she does not drink alcohol or use illicit drugs.  Allergies: No Known Allergies   (Not in a hospital admission)  Results for orders placed or performed during the hospital encounter of 05/09/15 (from the past 48 hour(s))  Basic metabolic panel     Status: Abnormal   Collection Time: 05/09/15  1:56 PM  Result Value Ref Range   Sodium 138 135 - 145 mmol/L   Potassium 4.2 3.5 - 5.1 mmol/L   Chloride 96 (L) 101 - 111 mmol/L   CO2 27 22 - 32 mmol/L   Glucose, Bld 250 (H) 65 - 99 mg/dL   BUN 10 6 - 20 mg/dL   Creatinine, Ser 0.74 0.44 - 1.00 mg/dL   Calcium 10.0 8.9 - 10.3 mg/dL   GFR calc non Af Amer >60 >60 mL/min   GFR calc Af Amer >60 >60 mL/min    Comment: (NOTE) The eGFR has been calculated using the CKD EPI equation. This calculation has not been validated in all clinical situations. eGFR's persistently <60 mL/min signify possible Chronic Kidney Disease.    Anion gap 15 5 - 15  CBC     Status: None   Collection Time: 05/09/15  1:56 PM  Result Value Ref Range   WBC 8.4 4.0 - 10.5 K/uL   RBC 4.79 3.87 - 5.11 MIL/uL   Hemoglobin 14.0 12.0 - 15.0 g/dL   HCT 41.0 36.0 - 46.0 %   MCV 85.6 78.0 - 100.0 fL   MCH 29.2 26.0 - 34.0 pg   MCHC 34.1 30.0 - 36.0 g/dL   RDW 12.4 11.5 - 15.5 %   Platelets 398 150 - 400 K/uL  I-stat troponin, ED (not at [facility], [facility])     Status: None   Collection Time: 05/09/15  2:06 PM  Result Value Ref Range   Troponin i, poc 0.00 0.00 - 0.08 ng/mL   Comment 3            Comment: Due to the release kinetics of cTnI, a negative result within the  first hours of the onset of symptoms does not rule out myocardial infarction with certainty. If myocardial infarction is still suspected, repeat the test at appropriate intervals.   I-Stat beta hCG blood, ED ([facility], [facility], [facility] only)     Status: None   Collection Time: 05/09/15  5:42 PM  Result Value Ref Range   I-stat hCG, quantitative <5.0 <5 mIU/mL   Comment 3            Comment:   GEST. AGE      CONC.  (mIU/mL)   <=1 WEEK        5 - 50     2 WEEKS       50 - 500     3 WEEKS       100 - 10,000     4 WEEKS     1,000 - 30,000        FEMALE AND NON-PREGNANT FEMALE:     LESS THAN 5 mIU/mL   Lipase, blood     Status: None   Collection Time: 05/09/15  6:12 PM  Result Value Ref Range   Lipase 30 11 - 51 U/L  I-stat troponin, ED     Status: None   Collection Time: 05/09/15  6:21 PM  Result Value Ref Range   Troponin i, poc 0.01 0.00 - 0.08 ng/mL   Comment 3             Comment: Due to the release kinetics of cTnI, a negative result within the first hours of the onset of symptoms does not rule out myocardial infarction with certainty. If myocardial infarction is still suspected, repeat the test at appropriate intervals.    Dg Chest 2 View  05/09/2015  CLINICAL DATA:  Mid chest pain.  Nausea vomiting. EXAM: CHEST  2 VIEW COMPARISON:  04/10/2015.  02/27/2015. FINDINGS: The heart size and mediastinal contours are within normal limits. Both lungs are clear. The visualized skeletal structures are unremarkable. IMPRESSION: No active cardiopulmonary disease. Electronically Signed   By: Marcello Moores  Register   On: 05/09/2015 14:10   Ct Angio Chest Pe W/cm &/or Wo Cm  05/09/2015  CLINICAL DATA:  Mid to left-sided chest pain beginning today. Was treated for pulmonary embolus 3 months ago at outside facility. Currently taking Eliquis. EXAM: CT ANGIOGRAPHY CHEST WITH CONTRAST TECHNIQUE: Multidetector CT imaging of the chest was performed using the standard protocol during bolus administration of intravenous contrast. Multiplanar CT image reconstructions and MIPs were obtained to evaluate the vascular anatomy. CONTRAST:  69m OMNIPAQUE IOHEXOL 350 MG/ML SOLN, 552mOMNIPAQUE IOHEXOL 350 MG/ML SOLN COMPARISON:  Chest CT angiograms dated 03/25/2015 and 02/18/2015. FINDINGS: Mediastinum/Lymph Nodes: Evaluation of the peripheral segmental and subsegmental pulmonary arteries is limited by patient body habitus and patient breathing motion artifact. There is no central pulmonary embolism identified within the main, lobar or central segmental pulmonary arteries. I cannot exclude a small peripheral pulmonary embolism on this exam. Thoracic aorta is normal in caliber and configuration. No aortic aneurysm or dissection. Heart size is normal. No pericardial effusion. No mass or enlarged lymph nodes within the mediastinum or perihilar regions. Trachea and central bronchi are unremarkable.  Lungs/Pleura: Lungs are clear. No pulmonary mass, infiltrate, or effusion. No pneumothorax. Upper abdomen: Limited images of the upper abdomen are unremarkable. Patient is status post cholecystectomy. Musculoskeletal: Superficial soft tissues are unremarkable. Old healed rib fractures noted bilaterally. No acute osseous abnormality seen. Review of the MIP images confirms the above findings. IMPRESSION: 1. No  central obstructing pulmonary embolism seen. Due to patient body habitus and fairly prominent breathing motion artifact, I cannot exclude a small peripheral pulmonary embolism. 2. Heart size is normal.  No pericardial effusion. 3. No aortic aneurysm or dissection. 4. Lungs are clear. Electronically Signed   By: Franki Cabot M.D.   On: 05/09/2015 19:07    Review of Systems  Constitutional: Negative for fever and chills.  Eyes: Negative for double vision.  Respiratory: Positive for shortness of breath. Negative for cough.   Cardiovascular: Negative for palpitations, orthopnea, claudication and leg swelling.  Gastrointestinal: Positive for nausea, vomiting and abdominal pain.  Genitourinary: Negative for dysuria.  Neurological: Negative for dizziness.    Blood pressure 174/104, pulse 114, temperature 98.5 F (36.9 C), temperature source Oral, resp. rate 29, weight 146.512 kg (323 lb), SpO2 97 %. Physical Exam  Constitutional: She is oriented to person, place, and time.  HENT:  Head: Normocephalic and atraumatic.  Eyes: Conjunctivae are normal. Pupils are equal, round, and reactive to light. Left eye exhibits no discharge. No scleral icterus.  Neck: Normal range of motion. Neck supple. No JVD present. No tracheal deviation present. No thyromegaly present.  Cardiovascular: Normal rate and regular rhythm.   Murmur (Soft systolic murmur and S4 gallop noted) heard. Mild anterior chest wall tenderness noted  Respiratory: Effort normal and breath sounds normal.  GI: Soft. Bowel sounds are normal.  She exhibits distension. There is no tenderness. There is no rebound.  Musculoskeletal: She exhibits no edema or tenderness.  Neurological: She is alert and oriented to person, place, and time.     Assessment/Plan Atypical chest pain with some features worrisome for angina rule out MI Uncontrolled hypertension Type 2 diabetes mellitus Morbid obesity History of pulmonary embolism in the recent past Hyperlipidemia Strong family history of coronary artery disease GERD Chronic back pain Plan As per orders  Charolette Forward, MD 05/09/2015, 8:04 PM

## 2015-05-10 ENCOUNTER — Encounter (HOSPITAL_COMMUNITY): Payer: Medicaid Other | Attending: Cardiology

## 2015-05-10 DIAGNOSIS — R079 Chest pain, unspecified: Secondary | ICD-10-CM | POA: Insufficient documentation

## 2015-05-10 DIAGNOSIS — I5189 Other ill-defined heart diseases: Secondary | ICD-10-CM | POA: Insufficient documentation

## 2015-05-10 DIAGNOSIS — I259 Chronic ischemic heart disease, unspecified: Secondary | ICD-10-CM | POA: Insufficient documentation

## 2015-05-10 LAB — GLUCOSE, CAPILLARY
GLUCOSE-CAPILLARY: 177 mg/dL — AB (ref 65–99)
GLUCOSE-CAPILLARY: 161 mg/dL — AB (ref 65–99)
Glucose-Capillary: 238 mg/dL — ABNORMAL HIGH (ref 65–99)
Glucose-Capillary: 178 mg/dL — ABNORMAL HIGH (ref 65–99)

## 2015-05-10 LAB — CBC
HCT: 37.2 % (ref 36.0–46.0)
HEMATOCRIT: 35.7 % — AB (ref 36.0–46.0)
HEMOGLOBIN: 12.2 g/dL (ref 12.0–15.0)
Hemoglobin: 12.5 g/dL (ref 12.0–15.0)
MCH: 29.5 pg (ref 26.0–34.0)
MCH: 29.2 pg (ref 26.0–34.0)
MCHC: 34.2 g/dL (ref 30.0–36.0)
MCHC: 33.6 g/dL (ref 30.0–36.0)
MCV: 86.4 fL (ref 78.0–100.0)
MCV: 86.9 fL (ref 78.0–100.0)
PLATELETS: 398 10*3/uL (ref 150–400)
PLATELETS: 380 10*3/uL (ref 150–400)
RBC: 4.13 MIL/uL (ref 3.87–5.11)
RBC: 4.28 MIL/uL (ref 3.87–5.11)
RDW: 12.7 % (ref 11.5–15.5)
RDW: 12.8 % (ref 11.5–15.5)
WBC: 8 10*3/uL (ref 4.0–10.5)
WBC: 7.3 10*3/uL (ref 4.0–10.5)

## 2015-05-10 LAB — HEPARIN LEVEL (UNFRACTIONATED)
HEPARIN UNFRACTIONATED: 0.18 [IU]/mL — AB (ref 0.30–0.70)
Heparin Unfractionated: 0.2 IU/mL — ABNORMAL LOW (ref 0.30–0.70)
Heparin Unfractionated: 0.48 IU/mL (ref 0.30–0.70)

## 2015-05-10 LAB — BASIC METABOLIC PANEL
Anion gap: 9 (ref 5–15)
BUN: 9 mg/dL (ref 6–20)
CALCIUM: 8.9 mg/dL (ref 8.9–10.3)
CO2: 28 mmol/L (ref 22–32)
CREATININE: 0.75 mg/dL (ref 0.44–1.00)
Chloride: 100 mmol/L — ABNORMAL LOW (ref 101–111)
GFR calc Af Amer: >60 mL/min (ref >60)
GLUCOSE: 225 mg/dL — AB (ref 65–99)
Potassium: 3.8 mmol/L (ref 3.5–5.1)
SODIUM: 137 mmol/L (ref 135–145)

## 2015-05-10 LAB — TROPONIN I

## 2015-05-10 LAB — LIPID PANEL
CHOL/HDL RATIO: 3.1 ratio
CHOLESTEROL: 138 mg/dL (ref 0–200)
HDL: 44 mg/dL (ref >40)
LDL Cholesterol: 76 mg/dL (ref 0–99)
Triglycerides: 88 mg/dL (ref <150)
VLDL: 18 mg/dL (ref 0–40)

## 2015-05-10 LAB — URINALYSIS, ROUTINE W REFLEX MICROSCOPIC
BILIRUBIN URINE: NEGATIVE
Glucose, UA: 100 mg/dL — AB
HGB URINE DIPSTICK: NEGATIVE
Ketones, ur: NEGATIVE mg/dL
Leukocytes, UA: NEGATIVE
Nitrite: NEGATIVE
PH: 6 (ref 5.0–8.0)
PROTEIN: 30 mg/dL — AB

## 2015-05-10 LAB — APTT: aPTT: 41 seconds — ABNORMAL HIGH (ref 24–37)

## 2015-05-10 LAB — URINE MICROSCOPIC-ADD ON

## 2015-05-10 MED ORDER — REGADENOSON 0.4 MG/5ML IV SOLN
0.4000 mg | Freq: Once | INTRAVENOUS | Status: DC
  Filled 2015-05-10: qty 5

## 2015-05-10 MED ORDER — HEPARIN BOLUS VIA INFUSION
2000.0000 [IU] | Freq: Once | INTRAVENOUS | Status: AC
  Administered 2015-05-10: 2000 [IU] via INTRAVENOUS
  Filled 2015-05-10: qty 2000

## 2015-05-10 MED ORDER — HYDROMORPHONE HCL 1 MG/ML IJ SOLN
0.5000 mg | Freq: Four times a day (QID) | INTRAMUSCULAR | Status: DC | PRN
  Administered 2015-05-10 – 2015-05-14 (×15): 0.5 mg via INTRAVENOUS
  Filled 2015-05-10 (×16): qty 1

## 2015-05-10 MED ORDER — REGADENOSON 0.4 MG/5ML IV SOLN
INTRAVENOUS | Status: AC
  Filled 2015-05-10: qty 5

## 2015-05-10 MED ORDER — HEPARIN BOLUS VIA INFUSION
1500.0000 [IU] | Freq: Once | INTRAVENOUS | Status: AC
  Administered 2015-05-10: 1500 [IU] via INTRAVENOUS
  Filled 2015-05-10: qty 1500

## 2015-05-10 NOTE — Progress Notes (Signed)
Chaplain presented to the patient's room for spiritual care support. The patient was being taken for testing in another department. Chaplain introduced self, and offered prayer prior to the patient's leaving the 3W Unit. Chaplain will follow up with the patient after testing is complete to present information for an Advance Directive.to have patient complete. Chaplain Janell Quiet [phone]

## 2015-05-10 NOTE — Progress Notes (Signed)
Dr Sharyn Lull is busy in the cath lab, therefore stress test portion today will be done tomorrow and patient will have resting images today. Nitropaste was removed at [employer]. Nurse Adelina Mings on 3W aware and will replace when patient returns to 3W.

## 2015-05-10 NOTE — Progress Notes (Signed)
ANTICOAGULATION CONSULT NOTE - Follow Up Consult  Pharmacy Consult for heparin Indication: CP and h/o PE  No Known Allergies  Patient Measurements: Height:  (172.7 cm) Weight: (!) 333 lb 12.8 oz (151.411 kg) IBW/kg (Calculated) : 63.9 Heparin Dosing Weight: 100kg  Vital Signs: Temp: 97.8 F (36.6 C) (02/09 1946) Temp Source: Oral (02/09 1946) BP: 130/79 mmHg (02/09 1946) Pulse Rate: 90 (02/09 1946)  Labs:  Recent Labs  05/09/15 1356 05/09/15 2206  05/10/15 0340 05/10/15 0702 05/10/15 1209 05/10/15 1344 05/10/15 2209  HGB 14.0 13.2  --  12.2 12.5  --   --   --   HCT 41.0 38.3  --  35.7* 37.2  --   --   --   PLT 398 417*  --  398 380  --   --   --   APTT  --  28  --   --  41*  --   --   --   HEPARINUNFRC  --   --   < >  --  0.18*  --  0.20* 0.48  CREATININE 0.74 0.66  --  0.75  --   --   --   --   TROPONINI  --  <0.03  --  <0.03  --  <0.03  --   --   < > = values in this interval not displayed.  Estimated Creatinine Clearance: 144.5 mL/min (by C-G formula based on Cr of 0.75).   Medical History: Past Medical History  Diagnosis Date  . Diabetes mellitus without complication (HCC)   . Hypertension   . Hypercholesteremia   . GERD (gastroesophageal reflux disease)   . Chronic back pain     Medications:  Prescriptions prior to admission  Medication Sig Dispense Refill Last Dose  . Apixaban (ELIQUIS PO) Take 1 tablet by mouth 2 (two) times daily.   05/09/2015 at 0800  . insulin detemir (LEVEMIR) 100 UNIT/ML injection Inject 70 Units into the skin daily. Patient states she is taking 70 units now. Per patient   05/09/2015 at Unknown time  . lisinopril (PRINIVIL,ZESTRIL) 40 MG tablet Take 40 mg by mouth daily.   05/09/2015 at Unknown time  . ondansetron (ZOFRAN) 4 MG tablet Take 1 tablet (4 mg total) by mouth every 8 (eight) hours as needed for nausea or vomiting. 20 tablet 0 05/09/2015 at Unknown time  . pantoprazole (PROTONIX) 40 MG tablet Take 40 mg by mouth daily.    05/09/2015 at Unknown time  . simvastatin (ZOCOR) 20 MG tablet Take 20 mg by mouth daily.   05/09/2015 at Unknown time  . traMADol (ULTRAM) 50 MG tablet Take 2 tablets (100 mg total) by mouth every 6 (six) hours as needed. (Patient taking differently: Take 100 mg by mouth 3 (three) times daily. ) 16 tablet 0 05/09/2015 at Unknown time   Scheduled:  . aspirin EC  81 mg Oral Daily  . insulin aspart  0-9 Units Subcutaneous TID WC  . insulin detemir  70 Units Subcutaneous Daily  . lisinopril  40 mg Oral Daily  . metoprolol tartrate  50 mg Oral BID  . nitroGLYCERIN  0.5 inch Topical 4 times per day  . pantoprazole  40 mg Oral Daily  . simvastatin  20 mg Oral Daily   Infusions:  . sodium chloride 10 mL/hr at 05/09/15 2150  . heparin 2,300 Units/hr (05/10/15 1545)    Assessment: 39yo female c/o CP since yesterday associated w/ some nausea, on Eliquis PTA for h/o  PE, transitioned to heparin while ruling out ACS. HL this am is sub-therapeutic on heparin 2100 units/hr. CBC wnl and stable. RN reports no s/s of bleeding. Monitor on heparin levels alone as Eliquis has been fully cleared.   PM follow-up: heparin level is at goal.  No bleeding or complications noted per chart notes.  Goal of Therapy:  Heparin level 0.3-0.7 units/ml Monitor platelets by anticoagulation protocol: Yes   Plan:  -Continue IV heparin at current rate. -Monitor daily HL, CBC and s/s of bleeding  -Nuclear stress test tomorrow am   Tad Moore BCPS  Clinical Pharmacist Pager [phone]  05/10/2015 10:51 PM

## 2015-05-10 NOTE — Progress Notes (Signed)
Nutrition Brief Note  Patient identified on the Malnutrition Screening Tool (MST) Report. Per discussion with RN, patient was upset after her test this AM and she is currently resting after being given some pain medication. She ate all of her lunch today.   Wt Readings from Last 15 Encounters:  05/10/15 333 lb 12.8 oz (151.411 kg)    Body mass index is 50.77 kg/(m^2). Patient meets criteria for class 3, extreme/morbid obesity based on current BMI.   Current diet order is Heart Healthy, patient is consuming approximately 100% of meals at this time. Labs and medications reviewed.   No nutrition interventions warranted at this time. If nutrition issues arise, please consult RD.   Joaquin Courts, RD, LDN, CNSC Pager [phone] After Hours Pager [phone]

## 2015-05-10 NOTE — Progress Notes (Signed)
Patient had emesis x2.  Patient rating pain 9/10 in chest and back.  RN spoke with Dr. Sharyn Lull on the phone and updated MD, orders received.

## 2015-05-10 NOTE — Plan of Care (Signed)
Problem: Safety: Goal: Ability to remain free from injury will improve Outcome: Progressing Per fall risk assessment patient is a moderate risk.  Patient has had a fall within the last six months and due to medications patient has received this shift, RN placed bed alarm on.  RN explained this to patient and patient stated understanding.  RN also instructed patient when she needed to get out of bed to call and wait for staff assistance before exiting bed.  Patient stated understanding.  A safe environment is being provided per staff this shift.

## 2015-05-10 NOTE — Progress Notes (Signed)
ANTICOAGULATION CONSULT NOTE - Follow Up Consult  Pharmacy Consult for heparin Indication: CP and h/o PE  No Known Allergies  Patient Measurements: Height:  (172.7 cm) Weight: (!) 333 lb 12.8 oz (151.411 kg) IBW/kg (Calculated) : 63.9 Heparin Dosing Weight: 100kg  Vital Signs: Temp: 98.4 F (36.9 C) (02/09 1153) Temp Source: Oral (02/09 1153) BP: 153/91 mmHg (02/09 1153) Pulse Rate: 101 (02/09 1153)  Labs:  Recent Labs  05/09/15 1356 05/09/15 2206 05/09/15 2225 05/10/15 0340 05/10/15 0702 05/10/15 1209 05/10/15 1344  HGB 14.0 13.2  --  12.2 12.5  --   --   HCT 41.0 38.3  --  35.7* 37.2  --   --   PLT 398 417*  --  398 380  --   --   APTT  --  28  --   --  41*  --   --   HEPARINUNFRC  --   --  <0.10*  --  0.18*  --  0.20*  CREATININE 0.74 0.66  --  0.75  --   --   --   TROPONINI  --  <0.03  --  <0.03  --  <0.03  --     Estimated Creatinine Clearance: 144.5 mL/min (by C-G formula based on Cr of 0.75).   Medical History: Past Medical History  Diagnosis Date  . Diabetes mellitus without complication (HCC)   . Hypertension   . Hypercholesteremia   . GERD (gastroesophageal reflux disease)   . Chronic back pain     Medications:  Prescriptions prior to admission  Medication Sig Dispense Refill Last Dose  . Apixaban (ELIQUIS PO) Take 1 tablet by mouth 2 (two) times daily.   05/09/2015 at 0800  . insulin detemir (LEVEMIR) 100 UNIT/ML injection Inject 70 Units into the skin daily. Patient states she is taking 70 units now. Per patient   05/09/2015 at Unknown time  . lisinopril (PRINIVIL,ZESTRIL) 40 MG tablet Take 40 mg by mouth daily.   05/09/2015 at Unknown time  . ondansetron (ZOFRAN) 4 MG tablet Take 1 tablet (4 mg total) by mouth every 8 (eight) hours as needed for nausea or vomiting. 20 tablet 0 05/09/2015 at Unknown time  . pantoprazole (PROTONIX) 40 MG tablet Take 40 mg by mouth daily.   05/09/2015 at Unknown time  . simvastatin (ZOCOR) 20 MG tablet Take 20 mg by  mouth daily.   05/09/2015 at Unknown time  . traMADol (ULTRAM) 50 MG tablet Take 2 tablets (100 mg total) by mouth every 6 (six) hours as needed. (Patient taking differently: Take 100 mg by mouth 3 (three) times daily. ) 16 tablet 0 05/09/2015 at Unknown time   Scheduled:  . aspirin EC  81 mg Oral Daily  . heparin  1,500 Units Intravenous Once  . insulin aspart  0-9 Units Subcutaneous TID WC  . insulin detemir  70 Units Subcutaneous Daily  . lisinopril  40 mg Oral Daily  . metoprolol tartrate  50 mg Oral BID  . nitroGLYCERIN  0.5 inch Topical 4 times per day  . pantoprazole  40 mg Oral Daily  . regadenoson      . simvastatin  20 mg Oral Daily   Infusions:  . sodium chloride 10 mL/hr at 05/09/15 2150  . heparin 2,100 Units/hr (05/10/15 1205)    Assessment: 39yo female c/o CP since yesterday associated w/ some nausea, on Eliquis PTA for h/o PE, transitioned to heparin while ruling out ACS. HL this am is sub-therapeutic on heparin  2100 units/hr. CBC wnl and stable. RN reports no s/s of bleeding. Monitor on heparin levels alone as Eliquis has been fully cleared.   Goal of Therapy:  Heparin level 0.3-0.7 units/ml Monitor platelets by anticoagulation protocol: Yes   Plan:  -Give heparin 1500 units bolus, then increase heparin infusion to 2300 units/hr  -F/u 6 hr HL -Monitor daily HL, CBC and s/s of bleeding  -Nuclear stress test tomorrow am   Vinnie Level, PharmD., BCPS Clinical Pharmacist Pager [phone]

## 2015-05-10 NOTE — Progress Notes (Signed)
Patient rating pain in back and chest 10/10 (see pain assessments for further details).  Patient also having nausea.  RN spoke with Dr. Sharyn Lull via phone, orders received.

## 2015-05-10 NOTE — Progress Notes (Signed)
ANTICOAGULATION CONSULT NOTE - Follow Up Consult  Pharmacy Consult for heparin Indication: CP and h/o PE  Labs:  Recent Labs  05/09/15 1356 05/09/15 2206 05/09/15 2225 05/10/15 0340 05/10/15 0702  HGB 14.0 13.2  --  12.2 12.5  HCT 41.0 38.3  --  35.7* 37.2  PLT 398 417*  --  398 380  APTT  --  28  --   --  41*  HEPARINUNFRC  --   --  <0.10*  --  0.18*  CREATININE 0.74 0.66  --  0.75  --   TROPONINI  --  <0.03  --  <0.03  --      Assessment: 39yo female subtherapeutic on heparin with initial dosing while Eliquis on hold; PTT seems to already be correlating with heparin levels.  Goal of Therapy:  Heparin level 0.3-0.7 units/ml   Plan:  Will rebolus with heparin 2000 units and increase gtt by 3 units/kgABW/hr to 2100 units/hr and check level in 6hr.  Vernard Gambles, PharmD, BCPS  05/10/2015,7:29 AM

## 2015-05-10 NOTE — Plan of Care (Signed)
Problem: Pain Managment: Goal: General experience of comfort will improve Outcome: Progressing Patient was rating pain 9-10/10.  Pain treated with PRN orders.  Last pain assessment patient rated pain 5/10 in back, chest pain resolved.  Patient has chronic back pain and stated that a 5/10 is her comfort goal.

## 2015-05-10 NOTE — Progress Notes (Signed)
Subjective:  Continues to have vague retrosternal and back pain without associated symptoms cardiac enzymes have been negative and resting nuclear scan today and schedule for stress portion tomorrow  Objective:  Vital Signs in the last 24 hours: Temp:  [98.3 F (36.8 C)-98.8 F (37.1 C)] 98.4 F (36.9 C) (02/09 1153) Pulse Rate:  [92-121] 101 (02/09 1153) Resp:  [13-38] 20 (02/09 1153) BP: (128-186)/(80-123) 153/91 mmHg (02/09 1153) SpO2:  [94 %-99 %] 98 % (02/09 1153) Weight:  [146.512 kg (323 lb)-152.2 kg (335 lb 8.6 oz)] 151.411 kg (333 lb 12.8 oz) (02/09 0451)  Intake/Output from previous day: 02/08 0701 - 02/09 0700 In: 1754.1 [P.O.:510; I.V.:244.1; IV Piggyback:1000] Out: 400 [Emesis/NG output:400] Intake/Output from this shift:    Physical Exam: Neck: no adenopathy, no carotid bruit, no JVD and supple, symmetrical, trachea midline Lungs: clear to auscultation bilaterally Heart: regular rate and rhythm, S1, S2 normal and Soft systolic murmur noted Abdomen: soft, non-tender; bowel sounds normal; no masses,  no organomegaly  Lab Results:  Recent Labs  05/10/15 0340 05/10/15 0702  WBC 8.0 7.3  HGB 12.2 12.5  PLT 398 380    Recent Labs  05/09/15 2206 05/10/15 0340  NA 140 137  K 4.0 3.8  CL 100* 100*  CO2 27 28  GLUCOSE 214* 225*  BUN 9 9  CREATININE 0.66 0.75    Recent Labs  05/09/15 2206 05/10/15 0340  TROPONINI <0.03 <0.03   Hepatic Function Panel  Recent Labs  05/09/15 2206  PROT 7.2  ALBUMIN 3.6  AST 15  ALT 22  ALKPHOS 68  BILITOT 1.0    Recent Labs  05/10/15 0340  CHOL 138   No results for input(s): PROTIME in the last 72 hours.  Imaging: Imaging results have been reviewed and Dg Chest 2 View  05/09/2015  CLINICAL DATA:  Mid chest pain.  Nausea vomiting. EXAM: CHEST  2 VIEW COMPARISON:  04/10/2015.  02/27/2015. FINDINGS: The heart size and mediastinal contours are within normal limits. Both lungs are clear. The visualized  skeletal structures are unremarkable. IMPRESSION: No active cardiopulmonary disease. Electronically Signed   By: Maisie Fus  Register   On: 05/09/2015 14:10   Ct Angio Chest Pe W/cm &/or Wo Cm  05/09/2015  CLINICAL DATA:  Mid to left-sided chest pain beginning today. Was treated for pulmonary embolus 3 months ago at outside facility. Currently taking Eliquis. EXAM: CT ANGIOGRAPHY CHEST WITH CONTRAST TECHNIQUE: Multidetector CT imaging of the chest was performed using the standard protocol during bolus administration of intravenous contrast. Multiplanar CT image reconstructions and MIPs were obtained to evaluate the vascular anatomy. CONTRAST:  50mL OMNIPAQUE IOHEXOL 350 MG/ML SOLN, 50mL OMNIPAQUE IOHEXOL 350 MG/ML SOLN COMPARISON:  Chest CT angiograms dated 03/25/2015 and 02/18/2015. FINDINGS: Mediastinum/Lymph Nodes: Evaluation of the peripheral segmental and subsegmental pulmonary arteries is limited by patient body habitus and patient breathing motion artifact. There is no central pulmonary embolism identified within the main, lobar or central segmental pulmonary arteries. I cannot exclude a small peripheral pulmonary embolism on this exam. Thoracic aorta is normal in caliber and configuration. No aortic aneurysm or dissection. Heart size is normal. No pericardial effusion. No mass or enlarged lymph nodes within the mediastinum or perihilar regions. Trachea and central bronchi are unremarkable. Lungs/Pleura: Lungs are clear. No pulmonary mass, infiltrate, or effusion. No pneumothorax. Upper abdomen: Limited images of the upper abdomen are unremarkable. Patient is status post cholecystectomy. Musculoskeletal: Superficial soft tissues are unremarkable. Old healed rib fractures noted bilaterally. No acute  osseous abnormality seen. Review of the MIP images confirms the above findings. IMPRESSION: 1. No central obstructing pulmonary embolism seen. Due to patient body habitus and fairly prominent breathing motion  artifact, I cannot exclude a small peripheral pulmonary embolism. 2. Heart size is normal.  No pericardial effusion. 3. No aortic aneurysm or dissection. 4. Lungs are clear. Electronically Signed   By: Bary Richard M.D.   On: 05/09/2015 19:07    Cardiac Studies:  Assessment/Plan:  Atypical chest pain with some features worrisome for angina MI ruled out Uncontrolled hypertension Type 2 diabetes mellitus Morbid obesity History of pulmonary embolism in the recent past Hyperlipidemia Strong family history of coronary artery disease GERD Chronic back pain   plan Continue present management Scheduled for nuclear stress test in a.m.   Rinaldo Cloud 05/10/2015, 12:10 PM

## 2015-05-10 NOTE — Plan of Care (Signed)
Problem: Education: Goal: Knowledge of [facility] Education information/materials will improve Outcome: Progressing RN reviewed plan of care with patient and updated white board so patient could reference if needed.  RN educated patient on all medications administered thus far this shift.  Patient stated she would like the chaplain to come and pray with her and would like more information on advance directives.  RN placed order for spiritual consult and updated patient.  RN also gave patient the advanced directive information booklet.  Patient stated understanding of everything discussed in this note.

## 2015-05-11 ENCOUNTER — Encounter (HOSPITAL_COMMUNITY): Payer: Medicaid Other

## 2015-05-11 DIAGNOSIS — I259 Chronic ischemic heart disease, unspecified: Secondary | ICD-10-CM | POA: Diagnosis not present

## 2015-05-11 DIAGNOSIS — I5189 Other ill-defined heart diseases: Secondary | ICD-10-CM | POA: Diagnosis not present

## 2015-05-11 DIAGNOSIS — R079 Chest pain, unspecified: Secondary | ICD-10-CM | POA: Diagnosis not present

## 2015-05-11 LAB — GLUCOSE, CAPILLARY
GLUCOSE-CAPILLARY: 140 mg/dL — AB (ref 65–99)
GLUCOSE-CAPILLARY: 240 mg/dL — AB (ref 65–99)
GLUCOSE-CAPILLARY: 231 mg/dL — AB (ref 65–99)
Glucose-Capillary: 125 mg/dL — ABNORMAL HIGH (ref 65–99)

## 2015-05-11 LAB — CBC
HCT: 37.6 % (ref 36.0–46.0)
Hemoglobin: 12 g/dL (ref 12.0–15.0)
MCH: 28.1 pg (ref 26.0–34.0)
MCHC: 31.9 g/dL (ref 30.0–36.0)
MCV: 88.1 fL (ref 78.0–100.0)
PLATELETS: 402 10*3/uL — AB (ref 150–400)
RBC: 4.27 MIL/uL (ref 3.87–5.11)
RDW: 12.7 % (ref 11.5–15.5)
WBC: 7.2 10*3/uL (ref 4.0–10.5)

## 2015-05-11 LAB — HEPARIN LEVEL (UNFRACTIONATED): Heparin Unfractionated: 0.57 IU/mL (ref 0.30–0.70)

## 2015-05-11 LAB — HEMOGLOBIN A1C
Hgb A1c MFr Bld: 10.2 % — ABNORMAL HIGH (ref 4.8–5.6)
Mean Plasma Glucose: 246 mg/dL

## 2015-05-11 MED ORDER — LIVING WELL WITH DIABETES BOOK
Freq: Once | Status: AC
  Administered 2015-05-11: 15:00:00
  Filled 2015-05-11: qty 1

## 2015-05-11 MED ORDER — REGADENOSON 0.4 MG/5ML IV SOLN
0.4000 mg | Freq: Once | INTRAVENOUS | Status: AC
  Administered 2015-05-11: 0.4 mg via INTRAVENOUS

## 2015-05-11 MED ORDER — REGADENOSON 0.4 MG/5ML IV SOLN
INTRAVENOUS | Status: AC
  Administered 2015-05-11: 0.4 mg
  Filled 2015-05-11: qty 5

## 2015-05-11 MED ORDER — TECHNETIUM TC 99M SESTAMIBI GENERIC - CARDIOLITE
30.0000 | Freq: Once | INTRAVENOUS | Status: AC | PRN
  Administered 2015-05-11: 30 via INTRAVENOUS

## 2015-05-11 MED ORDER — TECHNETIUM TC 99M SESTAMIBI GENERIC - CARDIOLITE
30.0000 | Freq: Once | INTRAVENOUS | Status: AC | PRN
  Administered 2015-05-10: 30 via INTRAVENOUS

## 2015-05-11 MED ORDER — HYDROMORPHONE HCL 1 MG/ML IJ SOLN
0.5000 mg | Freq: Once | INTRAMUSCULAR | Status: AC
  Administered 2015-05-11: 0.5 mg via INTRAVENOUS
  Filled 2015-05-11: qty 1

## 2015-05-11 NOTE — Progress Notes (Signed)
Inpatient Diabetes Program Recommendations  AACE/ADA: New Consensus Statement on Inpatient Glycemic Control (2015)  Target Ranges:  Prepandial:   less than 140 mg/dL      Peak postprandial:   less than 180 mg/dL (1-2 hours)      Critically ill patients:  140 - 180 mg/dL   Review of Glycemic Control:  Results for SHEVAWN, LANGENBERG (MRN [id number]) as of 05/11/2015 15:51  Ref. Range 05/10/2015 11:50 05/10/2015 16:58 05/10/2015 19:49 05/11/2015 07:27 05/11/2015 12:25  Glucose-Capillary Latest Ref Range: 65-99 mg/dL 409 (H) 811 (H) 914 (H) 125 (H) 140 (H)  Results for KAISYN, REINHOLD (MRN [id number]) as of 05/11/2015 15:51  Ref. Range 05/09/2015 22:06  Hemoglobin A1C Latest Ref Range: 4.8-5.6 % 10.2 (H)   Diabetes history: Type 2 diabetes Outpatient Diabetes medications: Levemir 70 units daily Current orders for Inpatient glycemic control:  Levemir 70 units daily, Novolog sensitive tid with meals  Inpatient Diabetes Program Recommendations:    Spoke with patient regarding elevated A1C.  She states that she has been seeing her Family Physician for diabetes management and that she had mentioned that she may need another "type of insulin" added.  Briefly discussed basal vs. Rapid acting insulin with patient.  Explained that Levemir is a basal insulin and therefore does not cover meal intake.  Discussed rapid acting insulin such as Novolog.  Patient verbalized understanding and states she will follow-up with PCP.  Also discussed goal A1C with patient and ordered Living Well with Diabetes booklet.  Thanks, Beryl Meager, RN, BC-ADM Inpatient Diabetes Coordinator Pager [phone] (8a-5p)

## 2015-05-11 NOTE — Progress Notes (Signed)
Subjective:  Continues to have vague retrosternal chest pain without associated symptoms. Patient underwent nuclear stress test today which showed moderate size moderate severity inducible ischemia in the cardiac And anteroseptal wall with EF of 55%.  Objective:  Vital Signs in the last 24 hours: Temp:  [97.8 F (36.6 C)-98.6 F (37 C)] 98.6 F (37 C) (02/10 1354) Pulse Rate:  [73-90] 89 (02/10 1354) Resp:  [15-17] 15 (02/10 1354) BP: (102-132)/(58-86) 127/82 mmHg (02/10 1354) SpO2:  [93 %-98 %] 93 % (02/10 1354) Weight:  [152.046 kg (335 lb 3.2 oz)] 152.046 kg (335 lb 3.2 oz) (02/10 0315)  Intake/Output from previous day:   Intake/Output from this shift: Total I/O In: 240 [P.O.:240] Out: 200 [Emesis/NG output:200]  Physical Exam: Neck: no adenopathy, no carotid bruit, no JVD and supple, symmetrical, trachea midline Lungs: clear to auscultation bilaterally Heart: regular rate and rhythm, S1, S2 normal and Soft systolic murmur noted Abdomen: soft, non-tender; bowel sounds normal; no masses,  no organomegaly Extremities: extremities normal, atraumatic, no cyanosis or edema  Lab Results:  Recent Labs  05/10/15 0702 05/11/15 0500  WBC 7.3 7.2  HGB 12.5 12.0  PLT 380 402*    Recent Labs  05/09/15 2206 05/10/15 0340  NA 140 137  K 4.0 3.8  CL 100* 100*  CO2 27 28  GLUCOSE 214* 225*  BUN 9 9  CREATININE 0.66 0.75    Recent Labs  05/10/15 0340 05/10/15 1209  TROPONINI <0.03 <0.03   Hepatic Function Panel  Recent Labs  05/09/15 2206  PROT 7.2  ALBUMIN 3.6  AST 15  ALT 22  ALKPHOS 68  BILITOT 1.0    Recent Labs  05/10/15 0340  CHOL 138   No results for input(s): PROTIME in the last 72 hours.  Imaging: Imaging results have been reviewed and Ct Angio Chest Pe W/cm &/or Wo Cm  05/09/2015  CLINICAL DATA:  Mid to left-sided chest pain beginning today. Was treated for pulmonary embolus 3 months ago at outside facility. Currently taking Eliquis. EXAM: CT  ANGIOGRAPHY CHEST WITH CONTRAST TECHNIQUE: Multidetector CT imaging of the chest was performed using the standard protocol during bolus administration of intravenous contrast. Multiplanar CT image reconstructions and MIPs were obtained to evaluate the vascular anatomy. CONTRAST:  50mL OMNIPAQUE IOHEXOL 350 MG/ML SOLN, 50mL OMNIPAQUE IOHEXOL 350 MG/ML SOLN COMPARISON:  Chest CT angiograms dated 03/25/2015 and 02/18/2015. FINDINGS: Mediastinum/Lymph Nodes: Evaluation of the peripheral segmental and subsegmental pulmonary arteries is limited by patient body habitus and patient breathing motion artifact. There is no central pulmonary embolism identified within the main, lobar or central segmental pulmonary arteries. I cannot exclude a small peripheral pulmonary embolism on this exam. Thoracic aorta is normal in caliber and configuration. No aortic aneurysm or dissection. Heart size is normal. No pericardial effusion. No mass or enlarged lymph nodes within the mediastinum or perihilar regions. Trachea and central bronchi are unremarkable. Lungs/Pleura: Lungs are clear. No pulmonary mass, infiltrate, or effusion. No pneumothorax. Upper abdomen: Limited images of the upper abdomen are unremarkable. Patient is status post cholecystectomy. Musculoskeletal: Superficial soft tissues are unremarkable. Old healed rib fractures noted bilaterally. No acute osseous abnormality seen. Review of the MIP images confirms the above findings. IMPRESSION: 1. No central obstructing pulmonary embolism seen. Due to patient body habitus and fairly prominent breathing motion artifact, I cannot exclude a small peripheral pulmonary embolism. 2. Heart size is normal.  No pericardial effusion. 3. No aortic aneurysm or dissection. 4. Lungs are clear. Electronically Signed  By: Bary Richard M.D.   On: 05/09/2015 19:07   Nm Myocar Multi W/spect W/wall Motion / Ef  05/11/2015  CLINICAL DATA:  Chest pain. Diabetes. Hypertension. Shortness of  breath. EXAM: MYOCARDIAL IMAGING WITH SPECT (REST AND PHARMACOLOGIC-STRESS) GATED LEFT VENTRICULAR WALL MOTION STUDY LEFT VENTRICULAR EJECTION FRACTION TECHNIQUE: Standard myocardial SPECT imaging was performed after resting intravenous injection of 10 mCi Tc-50m sestamibi. Subsequently, intravenous infusion of Lexiscan was performed under the supervision of the Cardiology staff. At peak effect of the drug, 30 mCi Tc-42m sestamibi was injected intravenously and standard myocardial SPECT imaging was performed. Quantitative gated imaging was also performed to evaluate left ventricular wall motion, and estimate left ventricular ejection fraction. COMPARISON:  08/25/2013 FINDINGS: Perfusion: Moderate sized moderate severity apical and anteroseptal region of inducible ischemia measuring up to 13% reversibility based on quantitative analysis. Wall Motion: Mild hypokinesis and mildly poor wall thickening in a portion of the lateral wall. Left Ventricular Ejection Fraction: 55 % End diastolic volume 130 ml End systolic volume 58 ml IMPRESSION: 1. Moderate size moderate severity region of apparent inducible ischemia in the cardiac apex and anteroseptal wall. 2. Mild hypokinesis in the lateral wall. 3. Left ventricular ejection fraction 55%. Moderate LV dilation at end-diastole and mild dilatation at end-systole. 4. High-risk stress test findings*. *2012 Appropriate Use Criteria for Coronary Revascularization Focused Update: J Am Coll Cardiol. 2012;59(9):857-881. http://content.dementiazones.com.aspx?articleid=[email or web address] Electronically Signed   By: Gaylyn Rong M.D.   On: 05/11/2015 16:06    Cardiac Studies:  Assessment/Plan:  Atypical chest pain with some features worrisome for angina MI ruled out positive nuclear stress test Uncontrolled hypertension Type 2 diabetes mellitus Morbid obesity History of pulmonary embolism in the recent past Hyperlipidemia Strong family history of coronary artery  disease GERD Chronic back pain   Plan Discussed with patient regarding nuclear stress test results and left cath possible PTCA stenting its risk and benefits i.e. death MI stroke need for emergency CABG local vascular complications etc. and consents for PCI. Will hold ELIQUIS  for now and continue with heparin Patient schedule for cardiac catheterization and possible PTCA stenting on Monday.  Dr. Algie Coffer on-call for weekend     Rinaldo Cloud 05/11/2015, 5:52 PM

## 2015-05-11 NOTE — Progress Notes (Signed)
ANTICOAGULATION CONSULT NOTE - Follow Up Consult  Pharmacy Consult for heparin Indication: CP and h/o PE  No Known Allergies  Patient Measurements: Height:  (172.7 cm) Weight: (!) 335 lb 3.2 oz (152.046 kg) IBW/kg (Calculated) : 63.9 Heparin Dosing Weight: 100kg  Vital Signs: Temp: 98.3 F (36.8 C) (02/10 0315) Temp Source: Oral (02/10 0315) BP: 102/66 mmHg (02/10 0315) Pulse Rate: 73 (02/10 0315)  Labs:  Recent Labs  05/09/15 1356 05/09/15 2206  05/10/15 0340 05/10/15 0702 05/10/15 1209 05/10/15 1344 05/10/15 2209 05/11/15 0500  HGB 14.0 13.2  --  12.2 12.5  --   --   --  12.0  HCT 41.0 38.3  --  35.7* 37.2  --   --   --  37.6  PLT 398 417*  --  398 380  --   --   --  402*  APTT  --  28  --   --  41*  --   --   --   --   HEPARINUNFRC  --   --   < >  --  0.18*  --  0.20* 0.48 0.57  CREATININE 0.74 0.66  --  0.75  --   --   --   --   --   TROPONINI  --  <0.03  --  <0.03  --  <0.03  --   --   --   < > = values in this interval not displayed.  Estimated Creatinine Clearance: 144.8 mL/min (by C-G formula based on Cr of 0.75).   Medical History: Past Medical History  Diagnosis Date  . Diabetes mellitus without complication (HCC)   . Hypertension   . Hypercholesteremia   . GERD (gastroesophageal reflux disease)   . Chronic back pain     Medications:  Prescriptions prior to admission  Medication Sig Dispense Refill Last Dose  . Apixaban (ELIQUIS PO) Take 1 tablet by mouth 2 (two) times daily.   05/09/2015 at 0800  . insulin detemir (LEVEMIR) 100 UNIT/ML injection Inject 70 Units into the skin daily. Patient states she is taking 70 units now. Per patient   05/09/2015 at Unknown time  . lisinopril (PRINIVIL,ZESTRIL) 40 MG tablet Take 40 mg by mouth daily.   05/09/2015 at Unknown time  . ondansetron (ZOFRAN) 4 MG tablet Take 1 tablet (4 mg total) by mouth every 8 (eight) hours as needed for nausea or vomiting. 20 tablet 0 05/09/2015 at Unknown time  . pantoprazole  (PROTONIX) 40 MG tablet Take 40 mg by mouth daily.   05/09/2015 at Unknown time  . simvastatin (ZOCOR) 20 MG tablet Take 20 mg by mouth daily.   05/09/2015 at Unknown time  . traMADol (ULTRAM) 50 MG tablet Take 2 tablets (100 mg total) by mouth every 6 (six) hours as needed. (Patient taking differently: Take 100 mg by mouth 3 (three) times daily. ) 16 tablet 0 05/09/2015 at Unknown time   Scheduled:  . aspirin EC  81 mg Oral Daily  . insulin aspart  0-9 Units Subcutaneous TID WC  . insulin detemir  70 Units Subcutaneous Daily  . lisinopril  40 mg Oral Daily  . metoprolol tartrate  50 mg Oral BID  . nitroGLYCERIN  0.5 inch Topical 4 times per day  . pantoprazole  40 mg Oral Daily  . simvastatin  20 mg Oral Daily   Infusions:  . sodium chloride 10 mL/hr at 05/09/15 2150  . heparin 2,300 Units/hr (05/11/15 0045)    Assessment:  40yo female admitted for CP, on Eliquis PTA for h/o PE, transitioned to heparin while ruling out ACS. HL this am remains therapeutic on heparin 2300 units/hr. H/H wnl and stable. Plt 402K. RN reports no s/s of bleeding.  Goal of Therapy:  Heparin level 0.3-0.7 units/ml Monitor platelets by anticoagulation protocol: Yes   Plan:  -Continue IV heparin at current rate. -Monitor daily HL, CBC and s/s of bleeding  -Nuclear stress test this AM   Vinnie Level, PharmD., BCPS Clinical Pharmacist Phone [phone]

## 2015-05-11 NOTE — Progress Notes (Signed)
MD notified of pt having 9 out of 10 lt sided CP radiating to her back. New orders for one time dose of dilaudid. Sanda Linger, RN

## 2015-05-11 NOTE — Progress Notes (Signed)
Pt c/o of 9 out of 10 CP radiating to her back. Pt given 0.5 mg of dilauldid as well as 12.5mg  of phenergan. Pt had an episode of emesis about 200 ml clear. Will continue to monitor the pt. Sanda Linger, RN

## 2015-05-11 NOTE — Progress Notes (Signed)
Spoke with patient about DM management at home. Discussed exercise ADA recommendations 30 minutes 5 days/week. Discussed portion sizes and reducing potato intake. Patient reports it is the most difficult to eliminate her potato intake. Discussed with patient the effects of stress on glucose levels and covered stress reducing techniques. Patient reports having diabetes for 5 years and not checking blood sugars consistently. Spoke with patient about monitoring sugar more consistently.   Claudean Kinds, SN   I have read and agree with students educational methods and information. Christena Deem, MSN, PCCN [employer]-Instructor

## 2015-05-12 DIAGNOSIS — Z23 Encounter for immunization: Secondary | ICD-10-CM | POA: Diagnosis not present

## 2015-05-12 DIAGNOSIS — R0789 Other chest pain: Secondary | ICD-10-CM | POA: Diagnosis present

## 2015-05-12 DIAGNOSIS — R931 Abnormal findings on diagnostic imaging of heart and coronary circulation: Secondary | ICD-10-CM | POA: Diagnosis present

## 2015-05-12 DIAGNOSIS — R072 Precordial pain: Secondary | ICD-10-CM | POA: Diagnosis present

## 2015-05-12 DIAGNOSIS — M549 Dorsalgia, unspecified: Secondary | ICD-10-CM | POA: Diagnosis present

## 2015-05-12 DIAGNOSIS — Z8249 Family history of ischemic heart disease and other diseases of the circulatory system: Secondary | ICD-10-CM | POA: Diagnosis not present

## 2015-05-12 DIAGNOSIS — I10 Essential (primary) hypertension: Secondary | ICD-10-CM | POA: Diagnosis present

## 2015-05-12 DIAGNOSIS — Z6841 Body Mass Index (BMI) 40.0 and over, adult: Secondary | ICD-10-CM | POA: Diagnosis not present

## 2015-05-12 DIAGNOSIS — Z7901 Long term (current) use of anticoagulants: Secondary | ICD-10-CM | POA: Diagnosis not present

## 2015-05-12 DIAGNOSIS — Z794 Long term (current) use of insulin: Secondary | ICD-10-CM | POA: Diagnosis not present

## 2015-05-12 DIAGNOSIS — R1084 Generalized abdominal pain: Secondary | ICD-10-CM | POA: Diagnosis present

## 2015-05-12 DIAGNOSIS — Z86711 Personal history of pulmonary embolism: Secondary | ICD-10-CM | POA: Diagnosis not present

## 2015-05-12 DIAGNOSIS — K219 Gastro-esophageal reflux disease without esophagitis: Secondary | ICD-10-CM | POA: Diagnosis present

## 2015-05-12 DIAGNOSIS — R112 Nausea with vomiting, unspecified: Secondary | ICD-10-CM | POA: Diagnosis present

## 2015-05-12 DIAGNOSIS — E7800 Pure hypercholesterolemia, unspecified: Secondary | ICD-10-CM | POA: Diagnosis present

## 2015-05-12 DIAGNOSIS — G8929 Other chronic pain: Secondary | ICD-10-CM | POA: Diagnosis present

## 2015-05-12 DIAGNOSIS — E119 Type 2 diabetes mellitus without complications: Secondary | ICD-10-CM | POA: Diagnosis present

## 2015-05-12 LAB — NM MYOCAR MULTI W/SPECT W/WALL MOTION / EF
CHL CUP STRESS STAGE 1 SBP: 105 mmHg
CHL CUP STRESS STAGE 1 SPEED: 0 mph
CHL CUP STRESS STAGE 2 GRADE: 0 %
CHL CUP STRESS STAGE 2 HR: 86 {beats}/min
CHL CUP STRESS STAGE 2 SPEED: 0 mph
CHL CUP STRESS STAGE 3 DBP: 61 mmHg
CHL CUP STRESS STAGE 3 SBP: 118 mmHg
CHL CUP STRESS STAGE 4 DBP: 61 mmHg
CHL CUP STRESS STAGE 4 GRADE: 0 %
CSEPPBP: 115 mmHg
CSEPPHR: 102 {beats}/min
CSEPPMHR: 56 %
Estimated workload: 1 METS
Stage 1 DBP: 58 mmHg
Stage 1 Grade: 0 %
Stage 1 HR: 86 {beats}/min
Stage 3 Grade: 0 %
Stage 3 HR: 106 {beats}/min
Stage 3 Speed: 0 mph
Stage 4 HR: 102 {beats}/min
Stage 4 SBP: 115 mmHg
Stage 4 Speed: 0 mph

## 2015-05-12 LAB — GLUCOSE, CAPILLARY
GLUCOSE-CAPILLARY: 132 mg/dL — AB (ref 65–99)
Glucose-Capillary: 205 mg/dL — ABNORMAL HIGH (ref 65–99)
Glucose-Capillary: 170 mg/dL — ABNORMAL HIGH (ref 65–99)
Glucose-Capillary: 135 mg/dL — ABNORMAL HIGH (ref 65–99)

## 2015-05-12 LAB — CBC
HCT: 36.5 % (ref 36.0–46.0)
Hemoglobin: 11.9 g/dL — ABNORMAL LOW (ref 12.0–15.0)
MCH: 28.5 pg (ref 26.0–34.0)
MCHC: 32.6 g/dL (ref 30.0–36.0)
MCV: 87.5 fL (ref 78.0–100.0)
PLATELETS: 395 10*3/uL (ref 150–400)
RBC: 4.17 MIL/uL (ref 3.87–5.11)
RDW: 12.8 % (ref 11.5–15.5)
WBC: 7.7 10*3/uL (ref 4.0–10.5)

## 2015-05-12 LAB — HEPARIN LEVEL (UNFRACTIONATED): Heparin Unfractionated: 0.37 IU/mL (ref 0.30–0.70)

## 2015-05-12 MED ORDER — OXYCODONE-ACETAMINOPHEN 5-325 MG PO TABS
1.0000 | ORAL_TABLET | Freq: Once | ORAL | Status: AC
  Administered 2015-05-12: 1 via ORAL
  Filled 2015-05-12: qty 1

## 2015-05-12 NOTE — Progress Notes (Signed)
Ref: Pcp Not In System   Subjective:  Not aware of possible blockage in heart but aware of further testing on heart. T max 99.3 degree F. No new complaints.  Objective:  Vital Signs in the last 24 hours: Temp:  [98.5 F (36.9 C)-99.3 F (37.4 C)] 99.3 F (37.4 C) (02/11 0505) Pulse Rate:  [79-89] 88 (02/11 0505) Cardiac Rhythm:  [-] Normal sinus rhythm (02/11 0745) Resp:  [15-18] 18 (02/11 0505) BP: (96-132)/(59-86) 129/74 mmHg (02/11 0826) SpO2:  [93 %-97 %] 97 % (02/11 0505) Weight:  [153.6 kg (338 lb 10 oz)] 153.6 kg (338 lb 10 oz) (02/11 0505)  Physical Exam: BP Readings from Last 1 Encounters:  05/12/15 129/74    Wt Readings from Last 1 Encounters:  05/12/15 153.6 kg (338 lb 10 oz)    Weight change: 1.554 kg (3 lb 6.8 oz)  HEENT: [place]/AT, Eyes-Brown, PERL, EOMI, Conjunctiva-Pink, Sclera-Non-icteric Neck: No JVD, No bruit, Trachea midline. Lungs:  Clear, Bilateral. Cardiac:  Regular rhythm, normal S1 and S2, no S3. II/VI systolic murmur. Abdomen:  Soft, non-tender. Extremities:  No edema present. No cyanosis. No clubbing. CNS: AxOx3, Cranial nerves grossly intact, moves all 4 extremities. Right handed. Skin: Warm and dry.   Intake/Output from previous day: 02/10 0701 - 02/11 0700 In: 680 [P.O.:680] Out: 200 [Emesis/NG output:200]    Lab Results: BMET    Component Value Date/Time   NA 137 05/10/2015 0340   NA 140 05/09/2015 2206   NA 138 05/09/2015 1356   K 3.8 05/10/2015 0340   K 4.0 05/09/2015 2206   K 4.2 05/09/2015 1356   CL 100* 05/10/2015 0340   CL 100* 05/09/2015 2206   CL 96* 05/09/2015 1356   CO2 28 05/10/2015 0340   CO2 27 05/09/2015 2206   CO2 27 05/09/2015 1356   GLUCOSE 225* 05/10/2015 0340   GLUCOSE 214* 05/09/2015 2206   GLUCOSE 250* 05/09/2015 1356   BUN 9 05/10/2015 0340   BUN 9 05/09/2015 2206   BUN 10 05/09/2015 1356   CREATININE 0.75 05/10/2015 0340   CREATININE 0.66 05/09/2015 2206   CREATININE 0.74 05/09/2015 1356   CALCIUM 8.9  05/10/2015 0340   CALCIUM 9.5 05/09/2015 2206   CALCIUM 10.0 05/09/2015 1356   GFRNONAA >60 05/10/2015 0340   GFRNONAA >60 05/09/2015 2206   GFRNONAA >60 05/09/2015 1356   GFRAA >60 05/10/2015 0340   GFRAA >60 05/09/2015 2206   GFRAA >60 05/09/2015 1356   CBC    Component Value Date/Time   WBC 7.7 05/12/2015 0412   RBC 4.17 05/12/2015 0412   HGB 11.9* 05/12/2015 0412   HCT 36.5 05/12/2015 0412   PLT 395 05/12/2015 0412   MCV 87.5 05/12/2015 0412   MCH 28.5 05/12/2015 0412   MCHC 32.6 05/12/2015 0412   RDW 12.8 05/12/2015 0412   LYMPHSABS 2.2 05/09/2015 2206   MONOABS 0.5 05/09/2015 2206   EOSABS 0.1 05/09/2015 2206   BASOSABS 0.0 05/09/2015 2206   HEPATIC Function Panel  Recent Labs  05/09/15 2206  PROT 7.2   HEMOGLOBIN A1C No components found for: HGA1C,  MPG CARDIAC ENZYMES Lab Results  Component Value Date   TROPONINI <0.03 05/10/2015   TROPONINI <0.03 05/10/2015   TROPONINI <0.03 05/09/2015   BNP No results for input(s): PROBNP in the last 8760 hours. TSH  Recent Labs  05/09/15 2206  TSH 0.781   CHOLESTEROL  Recent Labs  05/10/15 0340  CHOL 138    Scheduled Meds: . aspirin EC  81 mg Oral Daily  . insulin aspart  0-9 Units Subcutaneous TID WC  . insulin detemir  70 Units Subcutaneous Daily  . lisinopril  40 mg Oral Daily  . metoprolol tartrate  50 mg Oral BID  . nitroGLYCERIN  0.5 inch Topical 4 times per day  . pantoprazole  40 mg Oral Daily  . simvastatin  20 mg Oral Daily   Continuous Infusions: . sodium chloride 10 mL/hr at 05/09/15 2150  . heparin 2,300 Units/hr (05/12/15 0003)   PRN Meds:.acetaminophen, alum & mag hydroxide-simeth, HYDROmorphone (DILAUDID) injection, nitroGLYCERIN, ondansetron (ZOFRAN) IV, ondansetron, promethazine  Assessment/Plan: Atypical chest pain with some features worrisome for angina MI ruled out positive nuclear stress test Uncontrolled hypertension Type 2 diabetes mellitus Morbid obesity History of  pulmonary embolism in the recent past Hyperlipidemia Strong family history of coronary artery disease GERD Chronic back pain   Continue IV heparin.  Cardiac cath soon.  Increase activity    Orpah Cobb  MD  05/12/2015, 10:56 AM

## 2015-05-12 NOTE — Plan of Care (Signed)
Problem: Pain Managment: Goal: General experience of comfort will improve Outcome: Completed/Met Date Met:  05/12/15 Pt educated on pain scale and interventions. Pt verbalized understanding.

## 2015-05-12 NOTE — Progress Notes (Addendum)
Pt c/o chest pain 10 out of 10. EKG performed. Pt refusing nitro at this time. MD notified. New orders received. Will continue to monitor.   Reginold Agent, RN

## 2015-05-12 NOTE — Progress Notes (Signed)
Pt crying and c/o midsternal chest pain 11/10 worse with inspiration.  Pt SBP 172, pulse 106, oxygen sat 98% on room air. EKG shows no acute changes.  Pt has ntg paste intact  on right arm.  Pt requesting dilaudid for pain.  Per orders, Dilaudid can not be administered at present.  Pt received 1 SL ntg with relief 7/10 after 5 minutes.  Pt states it is continuing to improve and declines further NTG.  Pt requesting tylenol for headache.

## 2015-05-12 NOTE — Plan of Care (Signed)
Problem: Safety: Goal: Ability to remain free from injury will improve Outcome: Completed/Met Date Met:  05/12/15 Pt educated on safety measures put into place. Pt verbalized understanding.      

## 2015-05-12 NOTE — Progress Notes (Signed)
ANTICOAGULATION CONSULT NOTE - Follow Up Consult  Pharmacy Consult for heparin Indication: CP and h/o PE  No Known Allergies  Patient Measurements: Height:  (172.7 cm) Weight: (!) 338 lb 10 oz (153.6 kg) IBW/kg (Calculated) : 63.9 Heparin Dosing Weight: 100kg  Vital Signs: Temp: 99.3 F (37.4 C) (02/11 0505) Temp Source: Oral (02/11 0505) BP: 129/74 mmHg (02/11 0826) Pulse Rate: 88 (02/11 0505)  Labs:  Recent Labs  05/09/15 1356 05/09/15 2206  05/10/15 0340 05/10/15 0702 05/10/15 1209  05/10/15 2209 05/11/15 0500 05/12/15 0412  HGB 14.0 13.2  --  12.2 12.5  --   --   --  12.0 11.9*  HCT 41.0 38.3  --  35.7* 37.2  --   --   --  37.6 36.5  PLT 398 417*  --  398 380  --   --   --  402* 395  APTT  --  28  --   --  41*  --   --   --   --   --   HEPARINUNFRC  --   --   < >  --  0.18*  --   < > 0.48 0.57 0.37  CREATININE 0.74 0.66  --  0.75  --   --   --   --   --   --   TROPONINI  --  <0.03  --  <0.03  --  <0.03  --   --   --   --   < > = values in this interval not displayed.  Estimated Creatinine Clearance: 145.8 mL/min (by C-G formula based on Cr of 0.75).   Medical History: Past Medical History  Diagnosis Date  . Diabetes mellitus without complication (HCC)   . Hypertension   . Hypercholesteremia   . GERD (gastroesophageal reflux disease)   . Chronic back pain     Medications:  Prescriptions prior to admission  Medication Sig Dispense Refill Last Dose  . Apixaban (ELIQUIS PO) Take 1 tablet by mouth 2 (two) times daily.   05/09/2015 at 0800  . insulin detemir (LEVEMIR) 100 UNIT/ML injection Inject 70 Units into the skin daily. Patient states she is taking 70 units now. Per patient   05/09/2015 at Unknown time  . lisinopril (PRINIVIL,ZESTRIL) 40 MG tablet Take 40 mg by mouth daily.   05/09/2015 at Unknown time  . ondansetron (ZOFRAN) 4 MG tablet Take 1 tablet (4 mg total) by mouth every 8 (eight) hours as needed for nausea or vomiting. 20 tablet 0 05/09/2015 at  Unknown time  . pantoprazole (PROTONIX) 40 MG tablet Take 40 mg by mouth daily.   05/09/2015 at Unknown time  . simvastatin (ZOCOR) 20 MG tablet Take 20 mg by mouth daily.   05/09/2015 at Unknown time  . traMADol (ULTRAM) 50 MG tablet Take 2 tablets (100 mg total) by mouth every 6 (six) hours as needed. (Patient taking differently: Take 100 mg by mouth 3 (three) times daily. ) 16 tablet 0 05/09/2015 at Unknown time   Scheduled:  . aspirin EC  81 mg Oral Daily  . insulin aspart  0-9 Units Subcutaneous TID WC  . insulin detemir  70 Units Subcutaneous Daily  . lisinopril  40 mg Oral Daily  . metoprolol tartrate  50 mg Oral BID  . nitroGLYCERIN  0.5 inch Topical 4 times per day  . pantoprazole  40 mg Oral Daily  . simvastatin  20 mg Oral Daily   Infusions:  . sodium chloride  10 mL/hr at 05/09/15 2150  . heparin 2,300 Units/hr (05/12/15 0003)    Assessment: 39yo female admitted 05/09/2015 for CP, on Eliquis PTA for h/o PE, transitioned to heparin for cath on Monday.   PMH HTN, DM2, HLD, hx PE  HL 0.37 therapeutic on heparin 2300 units/hr. CBC wnl and stable. No noted bleeding.    Goal of Therapy:  Heparin level 0.3-0.7 units/ml Monitor platelets by anticoagulation protocol: Yes   Plan:  -Continue heparin 2300 units/h -Monitor daily HL, CBC and s/s of bleeding  -Cath on Monday   Hillery Aldo, [place].D., BCPS PGY2 Cardiology Pharmacy Resident Pager: [phone]  05/12/2015 10:10 AM

## 2015-05-13 ENCOUNTER — Inpatient Hospital Stay (HOSPITAL_COMMUNITY): Payer: Medicaid Other

## 2015-05-13 LAB — CBC
HEMATOCRIT: 35.2 % — AB (ref 36.0–46.0)
HEMOGLOBIN: 11.6 g/dL — AB (ref 12.0–15.0)
MCH: 28.7 pg (ref 26.0–34.0)
MCHC: 33 g/dL (ref 30.0–36.0)
MCV: 87.1 fL (ref 78.0–100.0)
PLATELETS: 383 10*3/uL (ref 150–400)
RBC: 4.04 MIL/uL (ref 3.87–5.11)
RDW: 12.5 % (ref 11.5–15.5)
WBC: 8.7 10*3/uL (ref 4.0–10.5)

## 2015-05-13 LAB — GLUCOSE, CAPILLARY
GLUCOSE-CAPILLARY: 154 mg/dL — AB (ref 65–99)
GLUCOSE-CAPILLARY: 129 mg/dL — AB (ref 65–99)
GLUCOSE-CAPILLARY: 139 mg/dL — AB (ref 65–99)
Glucose-Capillary: 169 mg/dL — ABNORMAL HIGH (ref 65–99)

## 2015-05-13 LAB — HEPARIN LEVEL (UNFRACTIONATED): HEPARIN UNFRACTIONATED: 0.4 [IU]/mL (ref 0.30–0.70)

## 2015-05-13 MED ORDER — SODIUM CHLORIDE 0.9 % WEIGHT BASED INFUSION
3.0000 mL/kg/h | INTRAVENOUS | Status: DC
Start: 2015-05-14 — End: 2015-05-14

## 2015-05-13 MED ORDER — ASPIRIN 81 MG PO CHEW
81.0000 mg | CHEWABLE_TABLET | ORAL | Status: AC
  Administered 2015-05-14: 81 mg via ORAL
  Filled 2015-05-13: qty 1

## 2015-05-13 MED ORDER — SODIUM CHLORIDE 0.9% FLUSH: 3.0000 mL | Freq: Two times a day (BID) | INTRAVENOUS | Status: DC

## 2015-05-13 MED ORDER — SODIUM CHLORIDE 0.9% FLUSH: 3.0000 mL | INTRAVENOUS | Status: DC | PRN

## 2015-05-13 MED ORDER — SODIUM CHLORIDE 0.9 % WEIGHT BASED INFUSION: 1.0000 mL/kg/h | INTRAVENOUS | Status: DC

## 2015-05-13 MED ORDER — SODIUM CHLORIDE 0.9 % IV SOLN: 250.0000 mL | INTRAVENOUS | Status: DC | PRN

## 2015-05-13 NOTE — Progress Notes (Signed)
ANTICOAGULATION CONSULT NOTE - Follow Up Consult  Pharmacy Consult for heparin Indication: CP and h/o Prince  No Known Allergies  Patient Measurements: Height:  (172.7 cm) Weight: (!) 336 lb (152.409 kg) IBW/kg (Calculated) : 63.9 Heparin Dosing Weight: 100kg  Vital Signs: Temp: 98.5 F (36.9 C) (02/12 0500) Temp Source: Oral (02/12 0500) BP: 142/77 mmHg (02/12 0500) Pulse Rate: 77 (02/12 0500)  Labs:  Recent Labs  05/10/15 1209  05/11/15 0500 05/12/15 0412 05/13/15 0255  HGB  --   < > 12.0 11.9* 11.6*  HCT  --   --  37.6 36.5 35.2*  PLT  --   --  402* 395 383  HEPARINUNFRC  --   < > 0.57 0.37 0.40  TROPONINI <0.03  --   --   --   --   < > = values in this interval not displayed.  Estimated Creatinine Clearance: 145.1 mL/min (by C-G formula based Barbara Cr of 0.75).   Medical History: Past Medical History  Diagnosis Date  . Diabetes mellitus without complication (HCC)   . Hypertension   . Hypercholesteremia   . GERD (gastroesophageal reflux disease)   . Chronic back pain     Medications:  Prescriptions prior to admission  Medication Sig Dispense Refill Last Dose  . Apixaban (ELIQUIS PO) Take 1 tablet by mouth 2 (two) times daily.   05/09/2015 at 0800  . insulin detemir (LEVEMIR) 100 UNIT/ML injection Inject 70 Units into the skin daily. Patient states she is taking 70 units now. Per patient   05/09/2015 at Unknown time  . lisinopril (PRINIVIL,ZESTRIL) 40 MG tablet Take 40 mg by mouth daily.   05/09/2015 at Unknown time  . ondansetron (ZOFRAN) 4 MG tablet Take 1 tablet (4 mg total) by mouth every 8 (eight) hours as needed for nausea or vomiting. 20 tablet 0 05/09/2015 at Unknown time  . pantoprazole (PROTONIX) 40 MG tablet Take 40 mg by mouth daily.   05/09/2015 at Unknown time  . simvastatin (ZOCOR) 20 MG tablet Take 20 mg by mouth daily.   05/09/2015 at Unknown time  . traMADol (ULTRAM) 50 MG tablet Take 2 tablets (100 mg total) by mouth every 6 (six) hours as needed.  (Patient taking differently: Take 100 mg by mouth 3 (three) times daily. ) 16 tablet 0 05/09/2015 at Unknown time   Scheduled:  . aspirin EC  81 mg Oral Daily  . insulin aspart  0-9 Units Subcutaneous TID WC  . insulin detemir  70 Units Subcutaneous Daily  . lisinopril  40 mg Oral Daily  . metoprolol tartrate  50 mg Oral BID  . nitroGLYCERIN  0.5 inch Topical 4 times per day  . pantoprazole  40 mg Oral Daily  . simvastatin  20 mg Oral Daily   Infusions:  . sodium chloride 10 mL/hr at 05/09/15 2150  . heparin 2,300 Units/hr (05/12/15 2332)    Assessment: 40yo female admitted 05/09/2015 for CP, [name], Barbara to heparin for cath Barbara Monday.   PMH HTN, DM2, HLD, hx Prince  HL remains 0.4 therapeutic Barbara heparin 2300 units/hr. CBC stable. No noted bleeding.    Goal of Therapy:  Heparin level 0.3-0.7 units/ml Monitor platelets by anticoagulation protocol: Yes   Plan:  -Continue heparin 2300 units/h -Monitor daily HL, CBC and s/sx of bleeding  -Cath Monday   Hillery Aldo, [place].D., BCPS PGY2 Cardiology Pharmacy Resident Pager: [phone]  05/13/2015 8:46 AM

## 2015-05-13 NOTE — Progress Notes (Signed)
Ref: Pcp Not In System   Subjective:  Generalized abdominal pain with nausea. H/O constipation.  Objective:  Vital Signs in the last 24 hours: Temp:  [98.1 F (36.7 C)-98.5 F (36.9 C)] 98.5 F (36.9 C) (02/12 0500) Pulse Rate:  [73-77] 77 (02/12 0500) Cardiac Rhythm:  [-] Normal sinus rhythm (02/12 0805) Resp:  [22] 22 (02/11 2110) BP: (126-172)/(73-94) 142/77 mmHg (02/12 0500) SpO2:  [98 %-100 %] 99 % (02/12 0500) Weight:  [152.409 kg (336 lb)] 152.409 kg (336 lb) (02/12 0500)  Physical Exam: BP Readings from Last 1 Encounters:  05/13/15 142/77    Wt Readings from Last 1 Encounters:  05/13/15 152.409 kg (336 lb)    Weight change: -1.191 kg (-2 lb 10 oz)  HEENT: [place]/AT, Eyes-Brown, PERL, EOMI, Conjunctiva-Pink, Sclera-Non-icteric Neck: No JVD, No bruit, Trachea midline. Lungs:  Clear, Bilateral. Cardiac:  Regular rhythm, normal S1 and S2, no S3. II/VI systolic murmur. Abdomen:  Soft, umbilical area tender. Extremities:  No edema present. No cyanosis. No clubbing. CNS: AxOx3, Cranial nerves grossly intact, moves all 4 extremities. Right handed. Skin: Warm and dry.   Intake/Output from previous day: 02/11 0701 - 02/12 0700 In: 362.3 [P.O.:120; I.V.:242.3] Out: -     Lab Results: BMET    Component Value Date/Time   NA 137 05/10/2015 0340   NA 140 05/09/2015 2206   NA 138 05/09/2015 1356   K 3.8 05/10/2015 0340   K 4.0 05/09/2015 2206   K 4.2 05/09/2015 1356   CL 100* 05/10/2015 0340   CL 100* 05/09/2015 2206   CL 96* 05/09/2015 1356   CO2 28 05/10/2015 0340   CO2 27 05/09/2015 2206   CO2 27 05/09/2015 1356   GLUCOSE 225* 05/10/2015 0340   GLUCOSE 214* 05/09/2015 2206   GLUCOSE 250* 05/09/2015 1356   BUN 9 05/10/2015 0340   BUN 9 05/09/2015 2206   BUN 10 05/09/2015 1356   CREATININE 0.75 05/10/2015 0340   CREATININE 0.66 05/09/2015 2206   CREATININE 0.74 05/09/2015 1356   CALCIUM 8.9 05/10/2015 0340   CALCIUM 9.5 05/09/2015 2206   CALCIUM 10.0  05/09/2015 1356   GFRNONAA >60 05/10/2015 0340   GFRNONAA >60 05/09/2015 2206   GFRNONAA >60 05/09/2015 1356   GFRAA >60 05/10/2015 0340   GFRAA >60 05/09/2015 2206   GFRAA >60 05/09/2015 1356   CBC    Component Value Date/Time   WBC 8.7 05/13/2015 0255   RBC 4.04 05/13/2015 0255   HGB 11.6* 05/13/2015 0255   HCT 35.2* 05/13/2015 0255   PLT 383 05/13/2015 0255   MCV 87.1 05/13/2015 0255   MCH 28.7 05/13/2015 0255   MCHC 33.0 05/13/2015 0255   RDW 12.5 05/13/2015 0255   LYMPHSABS 2.2 05/09/2015 2206   MONOABS 0.5 05/09/2015 2206   EOSABS 0.1 05/09/2015 2206   BASOSABS 0.0 05/09/2015 2206   HEPATIC Function Panel  Recent Labs  05/09/15 2206  PROT 7.2   HEMOGLOBIN A1C No components found for: HGA1C,  MPG CARDIAC ENZYMES Lab Results  Component Value Date   TROPONINI <0.03 05/10/2015   TROPONINI <0.03 05/10/2015   TROPONINI <0.03 05/09/2015   BNP No results for input(s): PROBNP in the last 8760 hours. TSH  Recent Labs  05/09/15 2206  TSH 0.781   CHOLESTEROL  Recent Labs  05/10/15 0340  CHOL 138    Scheduled Meds: . aspirin EC  81 mg Oral Daily  . insulin aspart  0-9 Units Subcutaneous TID WC  . insulin detemir  70 Units Subcutaneous Daily  . lisinopril  40 mg Oral Daily  . metoprolol tartrate  50 mg Oral BID  . nitroGLYCERIN  0.5 inch Topical 4 times per day  . pantoprazole  40 mg Oral Daily  . simvastatin  20 mg Oral Daily   Continuous Infusions: . sodium chloride 10 mL/hr at 05/09/15 2150  . heparin 2,300 Units/hr (05/12/15 2332)   PRN Meds:.acetaminophen, alum & mag hydroxide-simeth, HYDROmorphone (DILAUDID) injection, nitroGLYCERIN, ondansetron (ZOFRAN) IV, ondansetron, promethazine  Assessment/Plan: Atypical chest pain with some features worrisome for angina  MI ruled out  Abnormal/positive nuclear stress test Uncontrolled hypertension Type 2 diabetes mellitus Morbid obesity History of pulmonary embolism in the recent  past Hyperlipidemia Strong family history of coronary artery disease GERD Chronic back pain Abdominal pain  Possible constipation  X-ray abdomen, 2 view.   LOS: 1 day    Orpah Cobb  MD  05/13/2015, 9:49 AM

## 2015-05-14 ENCOUNTER — Encounter (HOSPITAL_COMMUNITY): Payer: Self-pay | Admitting: Cardiology

## 2015-05-14 ENCOUNTER — Encounter (HOSPITAL_COMMUNITY)
Admission: EM | Disposition: A | Discharge status: Home / Self Care | Payer: Self-pay | Source: Home / Self Care | Attending: Cardiology

## 2015-05-14 HISTORY — PX: CARDIAC CATHETERIZATION: SHX172

## 2015-05-14 LAB — GLUCOSE, CAPILLARY
Glucose-Capillary: 112 mg/dL — ABNORMAL HIGH (ref 65–99)
Glucose-Capillary: 110 mg/dL — ABNORMAL HIGH (ref 65–99)
Glucose-Capillary: 101 mg/dL — ABNORMAL HIGH (ref 65–99)

## 2015-05-14 LAB — HEPARIN LEVEL (UNFRACTIONATED): HEPARIN UNFRACTIONATED: 0.41 [IU]/mL (ref 0.30–0.70)

## 2015-05-14 LAB — CBC
HEMATOCRIT: 35.8 % — AB (ref 36.0–46.0)
HEMOGLOBIN: 11.8 g/dL — AB (ref 12.0–15.0)
MCH: 29.1 pg (ref 26.0–34.0)
MCHC: 33 g/dL (ref 30.0–36.0)
MCV: 88.4 fL (ref 78.0–100.0)
Platelets: 414 10*3/uL — ABNORMAL HIGH (ref 150–400)
RBC: 4.05 MIL/uL (ref 3.87–5.11)
RDW: 12.7 % (ref 11.5–15.5)
WBC: 9.9 10*3/uL (ref 4.0–10.5)

## 2015-05-14 LAB — POCT ACTIVATED CLOTTING TIME: ACTIVATED CLOTTING TIME: 142 s

## 2015-05-14 SURGERY — LEFT HEART CATH AND CORONARY ANGIOGRAPHY
Anesthesia: LOCAL

## 2015-05-14 MED ORDER — LIDOCAINE HCL (PF) 1 % IJ SOLN: INTRAMUSCULAR | Status: DC | PRN

## 2015-05-14 MED ORDER — HEPARIN (PORCINE) IN NACL 2-0.9 UNIT/ML-% IJ SOLN
INTRAMUSCULAR | Status: AC
  Filled 2015-05-14: qty 500

## 2015-05-14 MED ORDER — MIDAZOLAM HCL 2 MG/2ML IJ SOLN
INTRAMUSCULAR | Status: AC
  Filled 2015-05-14: qty 2

## 2015-05-14 MED ORDER — HEPARIN (PORCINE) IN NACL 2-0.9 UNIT/ML-% IJ SOLN
INTRAMUSCULAR | Status: AC
  Filled 2015-05-14: qty 1000

## 2015-05-14 MED ORDER — SODIUM CHLORIDE 0.9 % WEIGHT BASED INFUSION: 3.0000 mL/kg/h | INTRAVENOUS | Status: AC

## 2015-05-14 MED ORDER — SODIUM CHLORIDE 0.9% FLUSH: 3.0000 mL | Freq: Two times a day (BID) | INTRAVENOUS | Status: DC

## 2015-05-14 MED ORDER — LIDOCAINE HCL (PF) 1 % IJ SOLN
INTRAMUSCULAR | Status: AC
  Filled 2015-05-14: qty 30

## 2015-05-14 MED ORDER — FENTANYL CITRATE (PF) 100 MCG/2ML IJ SOLN
INTRAMUSCULAR | Status: DC | PRN
  Administered 2015-05-14 (×2): 25 ug via INTRAVENOUS

## 2015-05-14 MED ORDER — HEPARIN (PORCINE) IN NACL 2-0.9 UNIT/ML-% IJ SOLN
INTRAMUSCULAR | Status: DC | PRN
Start: 2015-05-14 — End: 2015-05-14
  Administered 2015-05-14: 14:00:00

## 2015-05-14 MED ORDER — FENTANYL CITRATE (PF) 100 MCG/2ML IJ SOLN
INTRAMUSCULAR | Status: AC
  Filled 2015-05-14: qty 2

## 2015-05-14 MED ORDER — METOPROLOL SUCCINATE ER 50 MG PO TB24: 50.0000 mg | ORAL_TABLET | Freq: Every day | ORAL | Status: AC

## 2015-05-14 MED ORDER — MIDAZOLAM HCL 2 MG/2ML IJ SOLN
INTRAMUSCULAR | Status: DC | PRN
  Administered 2015-05-14 (×2): 1 mg via INTRAVENOUS

## 2015-05-14 MED ORDER — SODIUM CHLORIDE 0.9% FLUSH: 3.0000 mL | INTRAVENOUS | Status: DC | PRN

## 2015-05-14 MED ORDER — LIDOCAINE HCL (PF) 1 % IJ SOLN
INTRAMUSCULAR | Status: DC | PRN
  Administered 2015-05-14: 16 mL

## 2015-05-14 MED ORDER — HYDROMORPHONE HCL 1 MG/ML IJ SOLN
0.5000 mg | Freq: Once | INTRAMUSCULAR | Status: AC
  Administered 2015-05-14: 0.5 mg via INTRAVENOUS

## 2015-05-14 MED ORDER — SODIUM CHLORIDE 0.9 % IV SOLN: 250.0000 mL | INTRAVENOUS | Status: DC | PRN

## 2015-05-14 MED ORDER — SODIUM CHLORIDE 0.45 % IV SOLN
INTRAVENOUS | Status: DC | PRN
  Administered 2015-05-14: 152 mL/h via INTRAVENOUS

## 2015-05-14 MED ORDER — ACETAMINOPHEN 325 MG PO TABS: 650.0000 mg | ORAL_TABLET | ORAL | Status: DC | PRN

## 2015-05-14 MED ORDER — APIXABAN 5 MG PO TABS: 5.0000 mg | ORAL_TABLET | Freq: Two times a day (BID) | ORAL | Status: AC

## 2015-05-14 SURGICAL SUPPLY — 8 items
CATH INFINITI 5FR MULTPACK ANG (CATHETERS) ×2 IMPLANT
HOVERMATT SINGLE USE (MISCELLANEOUS) ×1 IMPLANT
KIT HEART LEFT (KITS) ×2 IMPLANT
PACK CARDIAC CATHETERIZATION (CUSTOM PROCEDURE TRAY) ×2 IMPLANT
SHEATH PINNACLE 5F 10CM (SHEATH) ×2 IMPLANT
SYR MEDRAD MARK V 150ML (SYRINGE) ×2 IMPLANT
TRANSDUCER W/STOPCOCK (MISCELLANEOUS) ×2 IMPLANT
WIRE EMERALD 3MM-J .035X150CM (WIRE) ×2 IMPLANT

## 2015-05-14 NOTE — Progress Notes (Signed)
SATURATION QUALIFICATIONS: (This note is used to comply with regulatory documentation for home oxygen)  Patient Saturations on Room Air at Rest = 100 %  Patient Saturations on Room Air while Ambulating = 93 %  Patient Saturations on N/A Liters of oxygen while Ambulating =  0%  Please briefly explain why patient needs home oxygen: do not qualify.

## 2015-05-14 NOTE — Discharge Summary (Signed)
[name], [name]                 ACCOUNT NO.:  [id number]  MEDICAL RECORD NO.:  [id number]  LOCATION:  3W04C                        FACILITY:  [facility]  PHYSICIAN:  Eduardo Osier. Sharyn Lull, M.D. DATE OF BIRTH:  1975-04-22  DATE OF ADMISSION:  05/09/2015 DATE OF DISCHARGE:  05/14/2015                              DISCHARGE SUMMARY   ADMITTING DIAGNOSES: 1. Atypical chest pain with some features worrisome for angina, rule     out myocardial infarction. 2. Uncontrolled hypertension. 3. Type 2 diabetes mellitus. 4. Morbid obesity. 5. History of pulmonary embolism in the recent past. 6. Hyperlipidemia. 7. Family history of coronary artery disease. 8. Gastroesophageal reflux disease. 9. Chronic back pain.  DISCHARGE DIAGNOSES: 1. Status post atypical chest pain, myocardial infarction ruled out.     Strongly positive nuclear stress test.  Subsequently had left     cardiac catheterization today, noted to have no significant     coronary artery disease. 2. Hypertension. 3. Type 2 diabetes mellitus. 4. Morbid obesity. 5. History of pulmonary embolism in the recent past. 6. Hyperlipidemia. 7. Strong family history of coronary artery disease. 8. Gastroesophageal reflux disease. 9. Chronic back pain.  DISCHARGE HOME MEDICATIONS: 1. Metoprolol succinate 50 mg daily. 2. Levemir 70 units daily as before. 3. Lisinopril 40 mg daily as before. 4. Zofran 4 mg every 8 hours as needed as before. 5. Protonix 40 mg daily. 6. Simvastatin 20 mg daily. 7. Eliquis 5 mg twice daily. 8. Tramadol 50 mg every 6 hours as needed.  DIET:  Low salt, low cholesterol.  1800 calories ADA diet.  DISCHARGE INSTRUCTIONS:  The patient has been advised to monitor blood pressure and blood sugar daily in chart.  Post cardiac cath instructions have been given.  Follow up with me in 1 week.  The patient has been discussed extensively regarding lifestyle changes, diet, weight reduction, and monitoring of blood pressure  and blood sugar daily.  FOLLOWUP:  Follow up with me in 1 week.  Follow up with PMD as scheduled.  CONDITION AT DISCHARGE:  Stable.  BRIEF HISTORY AND HOSPITAL COURSE:  [name] is a 40 year old female with past medical history significant for hypertension, type 2 diabetes mellitus, hyperlipidemia, history of pulmonary embolism approximately 3 months ago, morbid obesity, positive family history of coronary artery disease.  She came to the ER complaining of left-sided chest pain radiating to left shoulder and back associated with nausea, vomiting, and mild shortness of breath.  She describes chest pain as tightness and burning in nature, also complains of vague abdominal pain.  Denies relation of chest pain to food, breathing, movement or position.  Denies any recent flu-like symptoms.  EKG done in the ED showed normal sinus rhythm with no acute ischemic changes.  First set of troponin-I was negative.  The patient had stress test many years ago at [facility] which was negative.  The patient also had CT angio of the chest today in the ED which showed no evidence of PE or dissection.  PHYSICAL EXAMINATION:  GENERAL:  She was alert, awake, oriented x3. VITAL SIGNS:  Blood pressure was 174/105, pulse 114, she was afebrile. EYE:  Conjunctivae were pink. NECK:  Supple.  No JVD.  No bruit. LUNGS:  Clear to auscultation without rhonchi or rales. CARDIOVASCULAR:  S1, S2 was normal.  She was tachycardic.  There was soft systolic murmur and S4 gallop.  There was mild anterior chest wall tenderness noted. ABDOMEN:  Soft, obese, distended, nontender. EXTREMITIES:  There was no clubbing, cyanosis, or edema.  LABORATORY DATA:  Sodium was 138, potassium 4.2, BUN 10, creatinine 0.74, glucose was 250, hemoglobin was 14, hematocrit 41.0, white count of 8.4.  Chest x-ray showed no active cardiopulmonary disease.  Three sets of troponin-I were normal.  Cholesterol was 138, triglycerides  88, HDL 44, LDL 76.  Her hemoglobin A1c was 10.2.  CT angio showed no central obstructive pulmonary embolism, heart size was normal, no pericardial effusion, no aortic aneurysm or dissection, lungs were clear.  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  BRIEF HOSPITAL COURSE:  The patient was admitted to telemetry unit.  MI was ruled out by serial enzymes and EKG.  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  The patient subsequently underwent left cardiac cath with selective left and right coronary angiography today which showed nonobstructive CAD.  The patient tolerated the procedure well.  Postprocedure, the patient did not have any episodes of chest pain during the hospital stay.  Her groin is stable with no evidence of hematoma or bruit.  The patient was counseled extensively regarding lifestyle changes, diet, and compliance with medication, and weight reduction, and monitoring blood pressure and blood sugar.  The patient will be discharged home later today and will be followed up in my office in 1 week and her PMD as scheduled.     Eduardo Osier. Sharyn Lull, M.D.     [facility]/MED[facility]  D:  05/14/2015  T:  05/14/2015  Job:  [id number]

## 2015-05-14 NOTE — Progress Notes (Signed)
ANTICOAGULATION CONSULT NOTE - Follow Up Consult  Pharmacy Consult for heparin Indication: CP and h/o PE  No Known Allergies  Patient Measurements: Height:  (172.7 cm) Weight: (!) 333 lb 11.2 oz (151.365 kg) IBW/kg (Calculated) : 63.9 Heparin Dosing Weight: 100kg  Vital Signs: Temp: 98.5 F (36.9 C) (02/13 0439) Temp Source: Oral (02/13 0439) BP: 148/95 mmHg (02/13 0757) Pulse Rate: 89 (02/13 0757)  Labs:  Recent Labs  05/12/15 0412 05/13/15 0255 05/14/15 0332  HGB 11.9* 11.6* 11.8*  HCT 36.5 35.2* 35.8*  PLT 395 383 414*  HEPARINUNFRC 0.37 0.40 0.41    Estimated Creatinine Clearance: 144.5 mL/min (by C-G formula based on Cr of 0.75).   Medical History: Past Medical History  Diagnosis Date  . Diabetes mellitus without complication (HCC)   . Hypertension   . Hypercholesteremia   . GERD (gastroesophageal reflux disease)   . Chronic back pain     Medications:  Prescriptions prior to admission  Medication Sig Dispense Refill Last Dose  . Apixaban (ELIQUIS PO) Take 1 tablet by mouth 2 (two) times daily.   05/09/2015 at 0800  . insulin detemir (LEVEMIR) 100 UNIT/ML injection Inject 70 Units into the skin daily. Patient states she is taking 70 units now. Per patient   05/09/2015 at Unknown time  . lisinopril (PRINIVIL,ZESTRIL) 40 MG tablet Take 40 mg by mouth daily.   05/09/2015 at Unknown time  . ondansetron (ZOFRAN) 4 MG tablet Take 1 tablet (4 mg total) by mouth every 8 (eight) hours as needed for nausea or vomiting. 20 tablet 0 05/09/2015 at Unknown time  . pantoprazole (PROTONIX) 40 MG tablet Take 40 mg by mouth daily.   05/09/2015 at Unknown time  . simvastatin (ZOCOR) 20 MG tablet Take 20 mg by mouth daily.   05/09/2015 at Unknown time  . traMADol (ULTRAM) 50 MG tablet Take 2 tablets (100 mg total) by mouth every 6 (six) hours as needed. (Patient taking differently: Take 100 mg by mouth 3 (three) times daily. ) 16 tablet 0 05/09/2015 at Unknown time   Scheduled:  .  aspirin EC  81 mg Oral Daily  . insulin aspart  0-9 Units Subcutaneous TID WC  . insulin detemir  70 Units Subcutaneous Daily  . lisinopril  40 mg Oral Daily  . metoprolol tartrate  50 mg Oral BID  . nitroGLYCERIN  0.5 inch Topical 4 times per day  . pantoprazole  40 mg Oral Daily  . simvastatin  20 mg Oral Daily  . sodium chloride flush  3 mL Intravenous Q12H   Infusions:  . sodium chloride 10 mL/hr at 05/09/15 2150  . sodium chloride 1 mL/kg/hr (05/14/15 0716)  . heparin 2,300 Units/hr (05/14/15 1042)    Assessment: 39yo female admitted 05/09/2015 for CP, on Eliquis PTA for h/o PE.  PMH HTN, DM2, HLD, hx PE  HL remains therapeutic on heparin 2300 units/hr. CBC stable. No noted bleeding.    Goal of Therapy:  Heparin level 0.3-0.7 units/ml Monitor platelets by anticoagulation protocol: Yes   Plan:  -Continue heparin 2300 units/h -Monitor daily HL, CBC and s/sx of bleeding  -Possible cath today   Agapito Games, PharmD, BCPS Clinical Pharmacist Pager: [phone] 05/14/2015 10:44 AM

## 2015-05-14 NOTE — H&P (View-Only) (Signed)
Subjective:  Patient continues to have vague chest pain off and on without associated symptoms.  Objective:  Vital Signs in the last 24 hours: Temp:  [98.4 F (36.9 C)-98.7 F (37.1 C)] 98.5 F (36.9 C) (02/13 0439) Pulse Rate:  [78-89] 89 (02/13 0757) BP: (147-158)/(61-95) 148/95 mmHg (02/13 0757) SpO2:  [96 %-100 %] 100 % (02/13 0439) Weight:  [151.365 kg (333 lb 11.2 oz)] 151.365 kg (333 lb 11.2 oz) (02/13 0439)  Intake/Output from previous day: 02/12 0701 - 02/13 0700 In: 493 [P.O.:240; I.V.:253] Out: 1 [Urine:1] Intake/Output from this shift:    Physical Exam: Neck: no adenopathy, no carotid bruit, no JVD and supple, symmetrical, trachea midline Lungs: clear to auscultation bilaterally Heart: regular rate and rhythm, S1, S2 normal and Soft systolic murmur noted Abdomen: soft, non-tender; bowel sounds normal; no masses,  no organomegaly Extremities: extremities normal, atraumatic, no cyanosis or edema  Lab Results:  Recent Labs  05/13/15 0255 05/14/15 0332  WBC 8.7 9.9  HGB 11.6* 11.8*  PLT 383 414*   No results for input(s): NA, K, CL, CO2, GLUCOSE, BUN, CREATININE in the last 72 hours. No results for input(s): TROPONINI in the last 72 hours.  Invalid input(s): CK, MB Hepatic Function Panel No results for input(s): PROT, ALBUMIN, AST, ALT, ALKPHOS, BILITOT, BILIDIR, IBILI in the last 72 hours. No results for input(s): CHOL in the last 72 hours. No results for input(s): PROTIME in the last 72 hours.  Imaging: Imaging results have been reviewed and Dg Abd 2 Views  05/13/2015  CLINICAL DATA:  Abdominal pain, nausea/ vomiting, constipation EXAM: ABDOMEN - 2 VIEW COMPARISON:  CT abdomen pelvis dated 12/16/2014 FINDINGS: Nonobstructive bowel gas pattern. Moderate right colonic stool burden. No evidence of free air under the diaphragm on the upright view. Cholecystectomy clips. Visualized osseous structures are within normal limits. IMPRESSION: No evidence of small  bowel obstruction or free air. Moderate right colonic stool burden. Electronically Signed   By: Sriyesh  Krishnan M.D.   On: 05/13/2015 13:31    Cardiac Studies:  Assessment/Plan:  Atypical chest pain with some features worrisome for angina MI ruled out positive nuclear stress test Uncontrolled hypertension Type 2 diabetes mellitus Morbid obesity History of pulmonary embolism in the recent past Hyperlipidemia Strong family history of coronary artery disease GERD Chronic back pain Plan Discussed with patient again regarding left cardiac cath possible PTCA stenting its risk and benefits i.e. death MI stroke need for emergency CABG local vascular complications etc. and consents for PCI  LOS: 2 days    Seibert Keeter 05/14/2015, 12:07 PM    

## 2015-05-14 NOTE — Discharge Instructions (Signed)
Information on my medicine - ELIQUIS (apixaban)  This medication education was reviewed with me or my healthcare representative as part of my discharge preparation.  The pharmacist that spoke with me during my hospital stay was:  Sampson Si, [facility]  Why was Eliquis prescribed for you? Eliquis was prescribed for you to reduce the risk of forming blood clots that can cause a stroke if you have a medical condition called atrial fibrillation (a type of irregular heartbeat) OR to reduce the risk of a blood clots forming after orthopedic surgery.  What do You need to know about Eliquis ? Take your Eliquis TWICE DAILY - one tablet in the morning and one tablet in the evening with or without food.  It would be best to take the doses about the same time each day.  If you have difficulty swallowing the tablet whole please discuss with your pharmacist how to take the medication safely.  Take Eliquis exactly as prescribed by your doctor and DO NOT stop taking Eliquis without talking to the doctor who prescribed the medication.  Stopping may increase your risk of developing a new clot or stroke.  Refill your prescription before you run out.  After discharge, you should have regular check-up appointments with your healthcare provider that is prescribing your Eliquis.  In the future your dose may need to be changed if your kidney function or weight changes by a significant amount or as you get older.  What do you do if you miss a dose? If you miss a dose, take it as soon as you remember on the same day and resume taking twice daily.  Do not take more than one dose of ELIQUIS at the same time.  Important Safety Information A possible side effect of Eliquis is bleeding. You should call your healthcare provider right away if you experience any of the following: ? Bleeding from an injury or your nose that does not stop. ? Unusual colored urine (red or dark brown) or unusual colored stools (red or  black). ? Unusual bruising for unknown reasons. ? A serious fall or if you hit your head (even if there is no bleeding).  Some medicines may interact with Eliquis and might increase your risk of bleeding or clotting while on Eliquis. To help avoid this, consult your healthcare provider or pharmacist prior to using any new prescription or non-prescription medications, including herbals, vitamins, non-steroidal anti-inflammatory drugs (NSAIDs) and supplements.  This website has more information on Eliquis (apixaban): [email or web address].com.cy.    Coronary Angiogram A coronary angiogram, also called coronary angiography, is an X-ray procedure used to look at the arteries in the heart. In this procedure, a dye (contrast dye) is injected through a long, hollow tube (catheter). The catheter is about the size of a piece of cooked spaghetti and is inserted through your groin, wrist, or arm. The dye is injected into each artery, and X-rays are then taken to show if there is a blockage in the arteries of your heart. LET [facility] CARE PROVIDER KNOW ABOUT:  Any allergies you have, including allergies to shellfish or contrast dye.   All medicines you are taking, including vitamins, herbs, eye drops, creams, and over-the-counter medicines.   Previous problems you or members of your family have had with the use of anesthetics.   Any blood disorders you have.   Previous surgeries you have had.  History of kidney problems or failure.   Other medical conditions you have. RISKS AND COMPLICATIONS  Generally,  a coronary angiogram is a safe procedure. However, problems can occur and include:  Allergic reaction to the dye.  Bleeding from the access site or other locations.  Kidney injury, especially in people with impaired kidney function.  Stroke (rare).  Heart attack (rare). BEFORE THE PROCEDURE   Do not eat or drink anything after midnight the night before the procedure or as directed by your  health care provider.   Ask your health care provider about changing or stopping your regular medicines. This is especially important if you are taking diabetes medicines or blood thinners. PROCEDURE  You may be given a medicine to help you relax (sedative) before the procedure. This medicine is given through an intravenous (IV) access tube that is inserted into one of your veins.   The area where the catheter will be inserted will be washed and shaved. This is usually done in the groin but may be done in the fold of your arm (near your elbow) or in the wrist.   A medicine will be given to numb the area where the catheter will be inserted (local anesthetic).   The health care provider will insert the catheter into an artery. The catheter will be guided by using a special type of X-ray (fluoroscopy) of the blood vessel being examined.   A special dye will then be injected into the catheter, and X-rays will be taken. The dye will help to show where any narrowing or blockages are located in the heart arteries.  AFTER THE PROCEDURE   If the procedure is done through the leg, you will be kept in bed lying flat for several hours. You will be instructed to not bend or cross your legs.  The insertion site will be checked frequently.   The pulse in your feet or wrist will be checked frequently.   Additional blood tests, X-rays, and an electrocardiogram may be done.    This information is not intended to replace advice given to you by your health care provider. Make sure you discuss any questions you have with your health care provider.   Document Released: 09/21/2002 Document Revised: 04/07/2014 Document Reviewed: 08/09/2012 Elsevier Interactive Patient Education [employer]

## 2015-05-14 NOTE — Interval H&P Note (Signed)
pci Cath Lab Visit (complete for each Cath Lab visit)  Clinical Evaluation Leading to the Procedure:   ACS: No.  Non-ACS:    Anginal Classification: CCS IV  Anti-ischemic medical therapy: Maximal Therapy (2 or more classes of medications)  Non-Invasive Test Results: Intermediate-risk stress test findings: cardiac mortality 1-3%/year  Prior CABG: No previous CABG      History and Physical Interval Note:  05/14/2015 12:46 PM  [name]  has presented today for surgery, with the diagnosis of Chest Pain Abnormal Stress Test  The various methods of treatment have been discussed with the patient and family. After consideration of risks, benefits and other options for treatment, the patient has consented to  Procedure(s): Left Heart Cath and Coronary Angiography (N/A) as a surgical intervention .  The patient's history has been reviewed, patient examined, no change in status, stable for surgery.  I have reviewed the patient's chart and labs.  Questions were answered to the patient's satisfaction.     Rinaldo Cloud

## 2015-05-14 NOTE — Discharge Summary (Signed)
Discharge summary dictated on 05/14/2015 dictation number is [phone]

## 2015-05-14 NOTE — Progress Notes (Signed)
Subjective:  Patient continues to have vague chest pain off and on without associated symptoms.  Objective:  Vital Signs in the last 24 hours: Temp:  [98.4 F (36.9 C)-98.7 F (37.1 C)] 98.5 F (36.9 C) (02/13 0439) Pulse Rate:  [78-89] 89 (02/13 0757) BP: (147-158)/(61-95) 148/95 mmHg (02/13 0757) SpO2:  [96 %-100 %] 100 % (02/13 0439) Weight:  [151.365 kg (333 lb 11.2 oz)] 151.365 kg (333 lb 11.2 oz) (02/13 0439)  Intake/Output from previous day: 02/12 0701 - 02/13 0700 In: 493 [P.O.:240; I.V.:253] Out: 1 [Urine:1] Intake/Output from this shift:    Physical Exam: Neck: no adenopathy, no carotid bruit, no JVD and supple, symmetrical, trachea midline Lungs: clear to auscultation bilaterally Heart: regular rate and rhythm, S1, S2 normal and Soft systolic murmur noted Abdomen: soft, non-tender; bowel sounds normal; no masses,  no organomegaly Extremities: extremities normal, atraumatic, no cyanosis or edema  Lab Results:  Recent Labs  05/13/15 0255 05/14/15 0332  WBC 8.7 9.9  HGB 11.6* 11.8*  PLT 383 414*   No results for input(s): NA, K, CL, CO2, GLUCOSE, BUN, CREATININE in the last 72 hours. No results for input(s): TROPONINI in the last 72 hours.  Invalid input(s): CK, MB Hepatic Function Panel No results for input(s): PROT, ALBUMIN, AST, ALT, ALKPHOS, BILITOT, BILIDIR, IBILI in the last 72 hours. No results for input(s): CHOL in the last 72 hours. No results for input(s): PROTIME in the last 72 hours.  Imaging: Imaging results have been reviewed and Dg Abd 2 Views  05/13/2015  CLINICAL DATA:  Abdominal pain, nausea/ vomiting, constipation EXAM: ABDOMEN - 2 VIEW COMPARISON:  CT abdomen pelvis dated 12/16/2014 FINDINGS: Nonobstructive bowel gas pattern. Moderate right colonic stool burden. No evidence of free air under the diaphragm on the upright view. Cholecystectomy clips. Visualized osseous structures are within normal limits. IMPRESSION: No evidence of small  bowel obstruction or free air. Moderate right colonic stool burden. Electronically Signed   By: Charline Bills M.D.   On: 05/13/2015 13:31    Cardiac Studies:  Assessment/Plan:  Atypical chest pain with some features worrisome for angina MI ruled out positive nuclear stress test Uncontrolled hypertension Type 2 diabetes mellitus Morbid obesity History of pulmonary embolism in the recent past Hyperlipidemia Strong family history of coronary artery disease GERD Chronic back pain Plan Discussed with patient again regarding left cardiac cath possible PTCA stenting its risk and benefits i.e. death MI stroke need for emergency CABG local vascular complications etc. and consents for PCI  LOS: 2 days    Rinaldo Cloud 05/14/2015, 12:07 PM

## 2015-05-15 NOTE — Care Management Note (Signed)
Case Management Note  Patient Details  Name: [name] MRN: [id number] Date of Birth: 20-Apr-1975  Subjective/Objective:     CP               Action/Plan: Late entry 05/14/2015 1800 NCM spoke to pt at bedside. States she can afford her medications. Pt reports some shortness of breath when ambulates. Unit RN will check oxygen levels when she can ambulate.  05/15/2015 0930 oxygen sat while ambulating per Unit RN's note 93%. No oxygen needed.   Expected Discharge Date:  05/14/2015             Expected Discharge Plan:  Home/Self Care  In-House Referral:  NA  Discharge planning Services  CM Consult  Post Acute Care Choice:  NA Choice offered to:  NA  DME Arranged:  N/A DME Agency:  NA  HH Arranged:  NA HH Agency:  NA  Status of Service:  Completed, signed off  Medicare Important Message Given:    Date Medicare IM Given:    Medicare IM give by:    Date Additional Medicare IM Given:    Additional Medicare Important Message give by:     If discussed at Long Length of Stay Meetings, dates discussed:    Additional Comments:  Elliot Cousin, RN 05/15/2015, 9:20 AM

## 2015-05-24 ENCOUNTER — Emergency Department (HOSPITAL_COMMUNITY): Payer: Medicaid Other

## 2015-05-24 ENCOUNTER — Emergency Department (HOSPITAL_COMMUNITY)
Admission: EM | Admit: 2015-05-24 | Discharge: 2015-05-25 | Disposition: A | Discharge status: Home / Self Care | Payer: Medicaid Other | Attending: Emergency Medicine | Admitting: Emergency Medicine

## 2015-05-24 DIAGNOSIS — R079 Chest pain, unspecified: Secondary | ICD-10-CM | POA: Diagnosis present

## 2015-05-24 DIAGNOSIS — K219 Gastro-esophageal reflux disease without esophagitis: Secondary | ICD-10-CM | POA: Diagnosis not present

## 2015-05-24 DIAGNOSIS — E119 Type 2 diabetes mellitus without complications: Secondary | ICD-10-CM | POA: Diagnosis not present

## 2015-05-24 DIAGNOSIS — Z7984 Long term (current) use of oral hypoglycemic drugs: Secondary | ICD-10-CM | POA: Insufficient documentation

## 2015-05-24 DIAGNOSIS — K2970 Gastritis, unspecified, without bleeding: Secondary | ICD-10-CM

## 2015-05-24 DIAGNOSIS — E7800 Pure hypercholesterolemia, unspecified: Secondary | ICD-10-CM | POA: Insufficient documentation

## 2015-05-24 DIAGNOSIS — R0789 Other chest pain: Secondary | ICD-10-CM

## 2015-05-24 DIAGNOSIS — I10 Essential (primary) hypertension: Secondary | ICD-10-CM | POA: Diagnosis not present

## 2015-05-24 DIAGNOSIS — Z794 Long term (current) use of insulin: Secondary | ICD-10-CM | POA: Insufficient documentation

## 2015-05-24 DIAGNOSIS — Z90710 Acquired absence of both cervix and uterus: Secondary | ICD-10-CM | POA: Insufficient documentation

## 2015-05-24 DIAGNOSIS — Z9049 Acquired absence of other specified parts of digestive tract: Secondary | ICD-10-CM | POA: Insufficient documentation

## 2015-05-24 DIAGNOSIS — F419 Anxiety disorder, unspecified: Secondary | ICD-10-CM | POA: Diagnosis not present

## 2015-05-24 DIAGNOSIS — Z79899 Other long term (current) drug therapy: Secondary | ICD-10-CM | POA: Diagnosis not present

## 2015-05-24 DIAGNOSIS — Z7901 Long term (current) use of anticoagulants: Secondary | ICD-10-CM | POA: Insufficient documentation

## 2015-05-24 DIAGNOSIS — R000 Tachycardia, unspecified: Secondary | ICD-10-CM | POA: Insufficient documentation

## 2015-05-24 DIAGNOSIS — G8929 Other chronic pain: Secondary | ICD-10-CM | POA: Diagnosis not present

## 2015-05-24 DIAGNOSIS — Z9889 Other specified postprocedural states: Secondary | ICD-10-CM | POA: Insufficient documentation

## 2015-05-24 LAB — CBC
HEMATOCRIT: 40.2 % (ref 36.0–46.0)
Hemoglobin: 13.4 g/dL (ref 12.0–15.0)
MCH: 29.3 pg (ref 26.0–34.0)
MCHC: 33.3 g/dL (ref 30.0–36.0)
MCV: 87.8 fL (ref 78.0–100.0)
PLATELETS: 410 10*3/uL — AB (ref 150–400)
RBC: 4.58 MIL/uL (ref 3.87–5.11)
RDW: 12.9 % (ref 11.5–15.5)
WBC: 9.3 10*3/uL (ref 4.0–10.5)

## 2015-05-24 LAB — HEPATIC FUNCTION PANEL
ALK PHOS: 51 U/L (ref 38–126)
ALT: 12 U/L — AB (ref 14–54)
AST: 12 U/L — ABNORMAL LOW (ref 15–41)
Albumin: 4.4 g/dL (ref 3.5–5.0)
TOTAL PROTEIN: 7.8 g/dL (ref 6.5–8.1)
Total Bilirubin: 0.9 mg/dL (ref 0.3–1.2)

## 2015-05-24 LAB — BASIC METABOLIC PANEL
Anion gap: 13 (ref 5–15)
BUN: 11 mg/dL (ref 6–20)
CALCIUM: 9.9 mg/dL (ref 8.9–10.3)
CO2: 25 mmol/L (ref 22–32)
CREATININE: 0.61 mg/dL (ref 0.44–1.00)
Chloride: 102 mmol/L (ref 101–111)
GFR calc Af Amer: >60 mL/min (ref >60)
GLUCOSE: 194 mg/dL — AB (ref 65–99)
Potassium: 4.3 mmol/L (ref 3.5–5.1)
SODIUM: 140 mmol/L (ref 135–145)

## 2015-05-24 LAB — LIPASE, BLOOD: LIPASE: 21 U/L (ref 11–51)

## 2015-05-24 LAB — I-STAT TROPONIN, ED: Troponin i, poc: 0 ng/mL (ref 0.00–0.08)

## 2015-05-24 MED ORDER — HYDROMORPHONE HCL 1 MG/ML IJ SOLN
1.0000 mg | Freq: Once | INTRAMUSCULAR | Status: AC
  Administered 2015-05-24: 1 mg via INTRAVENOUS
  Filled 2015-05-24: qty 1

## 2015-05-24 MED ORDER — ONDANSETRON HCL 4 MG/2ML IJ SOLN
4.0000 mg | Freq: Once | INTRAMUSCULAR | Status: AC
  Administered 2015-05-24: 4 mg via INTRAVENOUS
  Filled 2015-05-24: qty 2

## 2015-05-24 MED ORDER — LORAZEPAM 2 MG/ML IJ SOLN
0.5000 mg | Freq: Once | INTRAMUSCULAR | Status: AC
  Administered 2015-05-24: 0.5 mg via INTRAVENOUS
  Filled 2015-05-24: qty 1

## 2015-05-24 MED ORDER — GI COCKTAIL ~~LOC~~
30.0000 mL | Freq: Once | ORAL | Status: AC
  Administered 2015-05-25: 30 mL via ORAL
  Filled 2015-05-24: qty 30

## 2015-05-24 MED ORDER — IOHEXOL 350 MG/ML SOLN
100.0000 mL | Freq: Once | INTRAVENOUS | Status: AC | PRN
  Administered 2015-05-24: 100 mL via INTRAVENOUS

## 2015-05-24 NOTE — ED Notes (Signed)
Pt reports 10/10 left upper chest pain radiating to upper back and upper abdomen.  She began actively vomiting during assessment.  MD notified.

## 2015-05-24 NOTE — ED Notes (Signed)
Pt is actively drinking diet ginger ale

## 2015-05-24 NOTE — ED Notes (Signed)
Patient on phone, unable to triage at this time.

## 2015-05-24 NOTE — ED Provider Notes (Signed)
CSN: [id number]     Arrival date & time 05/24/15  1715 History   First MD Initiated Contact with Patient 05/24/15 2054     Chief Complaint  Patient presents with  . Chest Pain     Patient is a 40 y.o. female presenting with chest pain. The history is provided by the patient. No language interpreter was used.  Chest Pain  [name] is a 40 y.o. female who presents to the Emergency Department complaining of chest pain. About 10 am she developed upper abdominal pain described as burning in nature.  She has associated nausea and vomiting with multiple episodes of yellow emesis.  Shortly after her abdominal pain started she developed severe, sharp chest pain that radiates to her back.  She denies any fevers, diarrhea, dysuria.  She has had similar previous sxs in the past but they have been less severe.  She was admitted for a heart cath two weeks ago.    Past Medical History  Diagnosis Date  . Diabetes mellitus without complication (HCC)   . Hypertension   . Hypercholesteremia   . GERD (gastroesophageal reflux disease)   . Chronic back pain    Past Surgical History  Procedure Laterality Date  . Abdominal hysterectomy    . Cholecystectomy    . Tonsillectomy    . Cardiac catheterization N/A 05/14/2015    Procedure: Left Heart Cath and Coronary Angiography;  Surgeon: Rinaldo Cloud, MD;  Location: [facility] INVASIVE CV LAB;  Service: Cardiovascular;  Laterality: N/A;   No family history on file. Social History  Substance Use Topics  . Smoking status: Never Smoker   . Smokeless tobacco: Not on file  . Alcohol Use: No   OB History    No data available     Review of Systems  Cardiovascular: Positive for chest pain.  All other systems reviewed and are negative.     Allergies  Review of patient's allergies indicates no known allergies.  Home Medications   Prior to Admission medications   Medication Sig Start Date End Date Taking? Authorizing Provider  apixaban (ELIQUIS) 5 MG TABS  tablet Take 1 tablet (5 mg total) by mouth 2 (two) times daily. 05/15/15  Yes Rinaldo Cloud, MD  chlorpheniramine-HYDROcodone (TUSSIONEX) 10-8 MG/5ML SUER TAKE 1 TEASPOONFUL (5 ML) BY MOUTH EVERY 12 HOURS AS NEEDED FOR COUGH 05/14/15  Yes Historical Provider, MD  Cholecalciferol (D3-50) 50000 units capsule Take 50,000 Units by mouth every 28 (twenty-eight) days.  04/11/15  Yes Historical Provider, MD  hydrOXYzine (VISTARIL) 25 MG capsule Take 25 mg by mouth 3 (three) times daily.  04/11/15  Yes Historical Provider, MD  insulin detemir (LEVEMIR) 100 UNIT/ML injection Inject 70 Units into the skin at bedtime.    Yes Historical Provider, MD  lisinopril (PRINIVIL,ZESTRIL) 40 MG tablet Take 40 mg by mouth daily.   Yes Historical Provider, MD  metFORMIN (GLUCOPHAGE) 500 MG tablet Take 1,000 mg by mouth 2 (two) times daily with a meal.  04/11/15 04/10/16 Yes Historical Provider, MD  metoprolol succinate (TOPROL XL) 50 MG 24 hr tablet Take 1 tablet (50 mg total) by mouth daily. Take with or immediately following a meal. 05/14/15  Yes Rinaldo Cloud, MD  oxyCODONE-acetaminophen (PERCOCET) 7.5-325 MG tablet Take 1 tablet by mouth every 6 (six) hours as needed for severe pain.  05/09/15  Yes Historical Provider, MD  pantoprazole (PROTONIX) 40 MG tablet Take 40 mg by mouth daily.   Yes Historical Provider, MD  simvastatin (ZOCOR) 20 MG  tablet Take 20 mg by mouth daily.   Yes Historical Provider, MD  ondansetron (ZOFRAN) 4 MG tablet Take 1 tablet (4 mg total) by mouth every 8 (eight) hours as needed for nausea or vomiting. 04/16/13   Devoria Albe, MD  promethazine (PHENERGAN) 6.25 MG/5ML syrup Take 20 mLs (25 mg total) by mouth every 6 (six) hours as needed for nausea or vomiting. 05/25/15   Tilden Fossa, MD   BP 156/96 mmHg  Pulse 111  Temp(Src) 98.1 F (36.7 C) (Oral)  Resp 18  SpO2 99% Physical Exam  Constitutional: She is oriented to person, place, and time. She appears well-developed and well-nourished.  HENT:   Head: Normocephalic and atraumatic.  Cardiovascular: Regular rhythm.   No murmur heard. tachycardic  Pulmonary/Chest: Effort normal and breath sounds normal. No respiratory distress.  tachypneic  Abdominal: Soft. There is no rebound and no guarding.  Mild to moderate upper abdominal tenderness  Musculoskeletal: She exhibits no edema or tenderness.  Neurological: She is alert and oriented to person, place, and time.  Skin: Skin is warm and dry.  Psychiatric:  Anxious, tearful  Nursing note and vitals reviewed.   ED Course  Procedures (including critical care time) Labs Review Labs Reviewed  BASIC METABOLIC PANEL - Abnormal; Notable for the following:    Glucose, Bld 194 (*)    All other components within normal limits  CBC - Abnormal; Notable for the following:    Platelets 410 (*)    All other components within normal limits  HEPATIC FUNCTION PANEL - Abnormal; Notable for the following:    AST 12 (*)    ALT 12 (*)    Bilirubin, Direct <0.1 (*)    All other components within normal limits  URINALYSIS, ROUTINE W REFLEX MICROSCOPIC (NOT AT [facility]) - Abnormal; Notable for the following:    Specific Gravity, Urine >1.046 (*)    Protein, ur 100 (*)    All other components within normal limits  URINE MICROSCOPIC-ADD ON - Abnormal; Notable for the following:    Squamous Epithelial / LPF 6-30 (*)    All other components within normal limits  LIPASE, BLOOD  I-STAT TROPOININ, ED    Imaging Review Dg Chest 2 View  05/24/2015  CLINICAL DATA:  Chest pain with shortness of breath for 1 day EXAM: CHEST  2 VIEW COMPARISON:  Chest radiograph May 09, 2015; chest CT May 09, 2015 FINDINGS: Lungs are clear. Heart size and pulmonary vascularity are normal. No adenopathy. No pneumothorax. No bone lesions. IMPRESSION: No edema or consolidation. Electronically Signed   By: Bretta Bang III M.D.   On: 05/24/2015 18:07   Ct Angio Chest Aorta W/cm &/or Wo/cm  05/24/2015  CLINICAL DATA:   Chest, back, and abdominal pain. Status post cardiac catheterization 10 days prior. EXAM: CT ANGIOGRAPHY CHEST, ABDOMEN AND PELVIS TECHNIQUE: Multidetector CT imaging through the chest, abdomen and pelvis was performed using the standard protocol during bolus administration of intravenous contrast. Multiplanar reconstructed images and MIPs were obtained and reviewed to evaluate the vascular anatomy. CONTRAST:  OMNIPAQUE IOHEXOL 350 MG/ML SOLN COMPARISON:  Radiographs earlier this day. Chest CT PE protocol 15 days prior 05/09/2015. CT angiography of the chest abdomen and pelvis 02/27/2015 FINDINGS: CTA CHEST FINDINGS Normal caliber thoracic aorta without dissection, aneurysm, or hematoma. No significant atherosclerosis. Branching pattern from the aortic arch. No large central filling defects in the pulmonary arteries. The heart is normal in size. No mediastinal or hilar adenopathy. No pleural or pericardial effusion.  No consolidation, pulmonary edema, suspicious nodule or mass. There are no acute or suspicious osseous abnormalities. Remote left-sided rib fractures. Review of the MIP images confirms the above findings. CTA ABDOMEN AND PELVIS FINDINGS Normal caliber abdominal aorta without evidence of dissection or aneurysm. Limited arterial evaluation secondary to contrast bolus timing. No significant atherosclerosis. Celiac, superior mesenteric, and inferior mesenteric arteries are patent. The liver, spleen, adrenal glands, and pancreas are unremarkable. Postcholecystectomy. Symmetric renal enhancement, no hydronephrosis. Stomach is decompressed. There are no dilated or thickened bowel loops. Small to moderate stool burden. Appendix is normal. No free air or ascites. Within the pelvis the urinary bladder is physiologically distended. Uterus is surgically absent. Elongated cystic density in the left adnexa is unchanged in size and appearance from prior exam. Right ovary is prominent in size measuring 6.0 x 6.5  cm. No pelvic free fluid. Review of the MIP images confirms the above findings. IMPRESSION: 1. Normal caliber thoracoabdominal aorta without dissection or aneurysm. 2. No acute intrathoracic process. 3. Enlargement of the right ovary, increased in size from most recent CT from 3 months prior. Elongated cystic structure in the left adnexa is grossly unchanged. Recommend characterization with ultrasound, urgency based on clinical symptomatology. This could be done on an elective basis. Electronically Signed   By: Rubye Oaks M.D.   On: 05/24/2015 23:17   Ct Cta Abd/pel W/cm &/or W/o Cm  05/24/2015  CLINICAL DATA:  Chest, back, and abdominal pain. Status post cardiac catheterization 10 days prior. EXAM: CT ANGIOGRAPHY CHEST, ABDOMEN AND PELVIS TECHNIQUE: Multidetector CT imaging through the chest, abdomen and pelvis was performed using the standard protocol during bolus administration of intravenous contrast. Multiplanar reconstructed images and MIPs were obtained and reviewed to evaluate the vascular anatomy. CONTRAST:  OMNIPAQUE IOHEXOL 350 MG/ML SOLN COMPARISON:  Radiographs earlier this day. Chest CT PE protocol 15 days prior 05/09/2015. CT angiography of the chest abdomen and pelvis 02/27/2015 FINDINGS: CTA CHEST FINDINGS Normal caliber thoracic aorta without dissection, aneurysm, or hematoma. No significant atherosclerosis. Branching pattern from the aortic arch. No large central filling defects in the pulmonary arteries. The heart is normal in size. No mediastinal or hilar adenopathy. No pleural or pericardial effusion. No consolidation, pulmonary edema, suspicious nodule or mass. There are no acute or suspicious osseous abnormalities. Remote left-sided rib fractures. Review of the MIP images confirms the above findings. CTA ABDOMEN AND PELVIS FINDINGS Normal caliber abdominal aorta without evidence of dissection or aneurysm. Limited arterial evaluation secondary to contrast bolus timing. No  significant atherosclerosis. Celiac, superior mesenteric, and inferior mesenteric arteries are patent. The liver, spleen, adrenal glands, and pancreas are unremarkable. Postcholecystectomy. Symmetric renal enhancement, no hydronephrosis. Stomach is decompressed. There are no dilated or thickened bowel loops. Small to moderate stool burden. Appendix is normal. No free air or ascites. Within the pelvis the urinary bladder is physiologically distended. Uterus is surgically absent. Elongated cystic density in the left adnexa is unchanged in size and appearance from prior exam. Right ovary is prominent in size measuring 6.0 x 6.5 cm. No pelvic free fluid. Review of the MIP images confirms the above findings. IMPRESSION: 1. Normal caliber thoracoabdominal aorta without dissection or aneurysm. 2. No acute intrathoracic process. 3. Enlargement of the right ovary, increased in size from most recent CT from 3 months prior. Elongated cystic structure in the left adnexa is grossly unchanged. Recommend characterization with ultrasound, urgency based on clinical symptomatology. This could be done on an elective basis. Electronically Signed   By: Shawna Orleans  Ehinger M.D.   On: 05/24/2015 23:17   I have personally reviewed and evaluated these images and lab results as part of my medical decision-making.   EKG Interpretation   Date/Time:  Thursday May 24 2015 17:24:15 EST Ventricular Rate:  127 PR Interval:  138 QRS Duration: 93 QT Interval:  325 QTC Calculation: 472 R Axis:   -82 Text Interpretation:  Sinus tachycardia Markedly posterior QRS axis  Probable anteroseptal infarct, old Baseline wander in lead(s) I III aVR  aVL aVF V1 missing lead limits interpretation Confirmed by Lincoln Brigham  [phone]) on 05/24/2015 9:09:03 PM      MDM   Final diagnoses:  Gastritis  Atypical chest pain    Pt with DM, morbid obesity here with chest pain, vomiting, abdominal pain.  Presentation not c/w ACS, PE, dissection,  pancreatitis.  Pt is improved in ED after recheck.  She is tolerating oral fluids. Reviewed records from prior hospitalization.  Plan to d/c home with outpatient follow up.  Return precautions were discussed.    Tilden Fossa, MD 05/25/15 [phone]

## 2015-05-24 NOTE — ED Notes (Signed)
Pt c/o chest pain, back pain, abdominal pain, n/v, SOB. Pt unable to sit still.

## 2015-05-25 LAB — URINALYSIS, ROUTINE W REFLEX MICROSCOPIC
Bilirubin Urine: NEGATIVE
GLUCOSE, UA: NEGATIVE mg/dL
Hgb urine dipstick: NEGATIVE
KETONES UR: NEGATIVE mg/dL
LEUKOCYTES UA: NEGATIVE
NITRITE: NEGATIVE
PROTEIN: 100 mg/dL — AB
Specific Gravity, Urine: >1.046 — ABNORMAL HIGH (ref 1.005–1.030)
pH: 6.5 (ref 5.0–8.0)

## 2015-05-25 LAB — URINE MICROSCOPIC-ADD ON: Bacteria, UA: NONE SEEN

## 2015-05-25 MED ORDER — PROMETHAZINE HCL 6.25 MG/5ML PO SYRP: 25.0000 mg | ORAL_SOLUTION | Freq: Four times a day (QID) | ORAL | Status: AC | PRN

## 2015-05-25 NOTE — Discharge Instructions (Signed)
Your CT scan showed enlargement of your ovary, this will need to be followed up by your OBGYN  Gastritis, Adult Gastritis is soreness and swelling (inflammation) of the lining of the stomach. Gastritis can develop as a sudden onset (acute) or long-term (chronic) condition. If gastritis is not treated, it can lead to stomach bleeding and ulcers. CAUSES  Gastritis occurs when the stomach lining is weak or damaged. Digestive juices from the stomach then inflame the weakened stomach lining. The stomach lining may be weak or damaged due to viral or bacterial infections. One common bacterial infection is the Helicobacter pylori infection. Gastritis can also result from excessive alcohol consumption, taking certain medicines, or having too much acid in the stomach.  SYMPTOMS  In some cases, there are no symptoms. When symptoms are present, they may include:  Pain or a burning sensation in the upper abdomen.  Nausea.  Vomiting.  An uncomfortable feeling of fullness after eating. DIAGNOSIS  Your caregiver may suspect you have gastritis based on your symptoms and a physical exam. To determine the cause of your gastritis, your caregiver may perform the following:  Blood or stool tests to check for the H pylori bacterium.  Gastroscopy. A thin, flexible tube (endoscope) is passed down the esophagus and into the stomach. The endoscope has a light and camera on the end. Your caregiver uses the endoscope to view the inside of the stomach.  Taking a tissue sample (biopsy) from the stomach to examine under a microscope. TREATMENT  Depending on the cause of your gastritis, medicines may be prescribed. If you have a bacterial infection, such as an H pylori infection, antibiotics may be given. If your gastritis is caused by too much acid in the stomach, H2 blockers or antacids may be given. Your caregiver may recommend that you stop taking aspirin, ibuprofen, or other nonsteroidal anti-inflammatory drugs  (NSAIDs). HOME CARE INSTRUCTIONS  Only take over-the-counter or prescription medicines as directed by your caregiver.  If you were given antibiotic medicines, take them as directed. Finish them even if you start to feel better.  Drink enough fluids to keep your urine clear or pale yellow.  Avoid foods and drinks that make your symptoms worse, such as:  Caffeine or alcoholic drinks.  Chocolate.  Peppermint or mint flavorings.  Garlic and onions.  Spicy foods.  Citrus fruits, such as oranges, lemons, or limes.  Tomato-based foods such as sauce, chili, salsa, and pizza.  Fried and fatty foods.  Eat small, frequent meals instead of large meals. SEEK IMMEDIATE MEDICAL CARE IF:   You have black or dark red stools.  You vomit blood or material that looks like coffee grounds.  You are unable to keep fluids down.  Your abdominal pain gets worse.  You have a fever.  You do not feel better after 1 week.  You have any other questions or concerns. MAKE SURE YOU:  Understand these instructions.  Will watch your condition.  Will get help right away if you are not doing well or get worse.   This information is not intended to replace advice given to you by your health care provider. Make sure you discuss any questions you have with your health care provider.   Document Released: 03/11/2001 Document Revised: 09/16/2011 Document Reviewed: 04/30/2011 Elsevier Interactive Patient Education 2016 [employer] Clear Liquid Diet A clear liquid diet is a short-term diet that is prescribed to provide the necessary fluid and basic energy you need when you can have nothing else. The  clear liquid diet consists of liquids or solids that will become liquid at room temperature. You should be able to see through the liquid. There are many reasons that you may be restricted to clear liquids, such as:  When you have a sudden-onset (acute) condition that occurs before or after surgery.  To  help your body slowly get adjusted to food again after a long period when you were unable to have food.  Replacement of fluids when you have a diarrheal disease.  When you are going to have certain exams, such as a colonoscopy, in which instruments are inserted inside your body to look at parts of your digestive system. WHAT CAN I HAVE? A clear liquid diet does not provide all the nutrients you need. It is important to choose a variety of the following items to get as many nutrients as possible:  Vegetable juices that do not have pulp.  Fruit juices and fruit drinks that do not have pulp.  Coffee (regular or decaffeinated), tea, or soda at the discretion of your health care provider.  Clear bouillon, broth, or strained broth-based soups.  High-protein and flavored gelatins.  Sugar or honey.  Ices or frozen ice pops that do not contain milk. If you are not sure whether you can have certain items, you should ask your health care provider. You may also ask your health care provider if there are any other clear liquid options.   This information is not intended to replace advice given to you by your health care provider. Make sure you discuss any questions you have with your health care provider.   Document Released: 03/17/2005 Document Revised: 03/22/2013 Document Reviewed: 02/11/2013 Elsevier Interactive Patient Education [employer]

## 2016-06-12 IMAGING — CR DG CHEST 2V
2 series · 2 of 2 positions shown · non-contrast
Comparison: Chest radiograph May 09, 2015; chest CT May 09, 2015

CLINICAL DATA: Chest pain with shortness of breath for 1 day

EXAM:
CHEST  2 VIEW

[w chest pa]
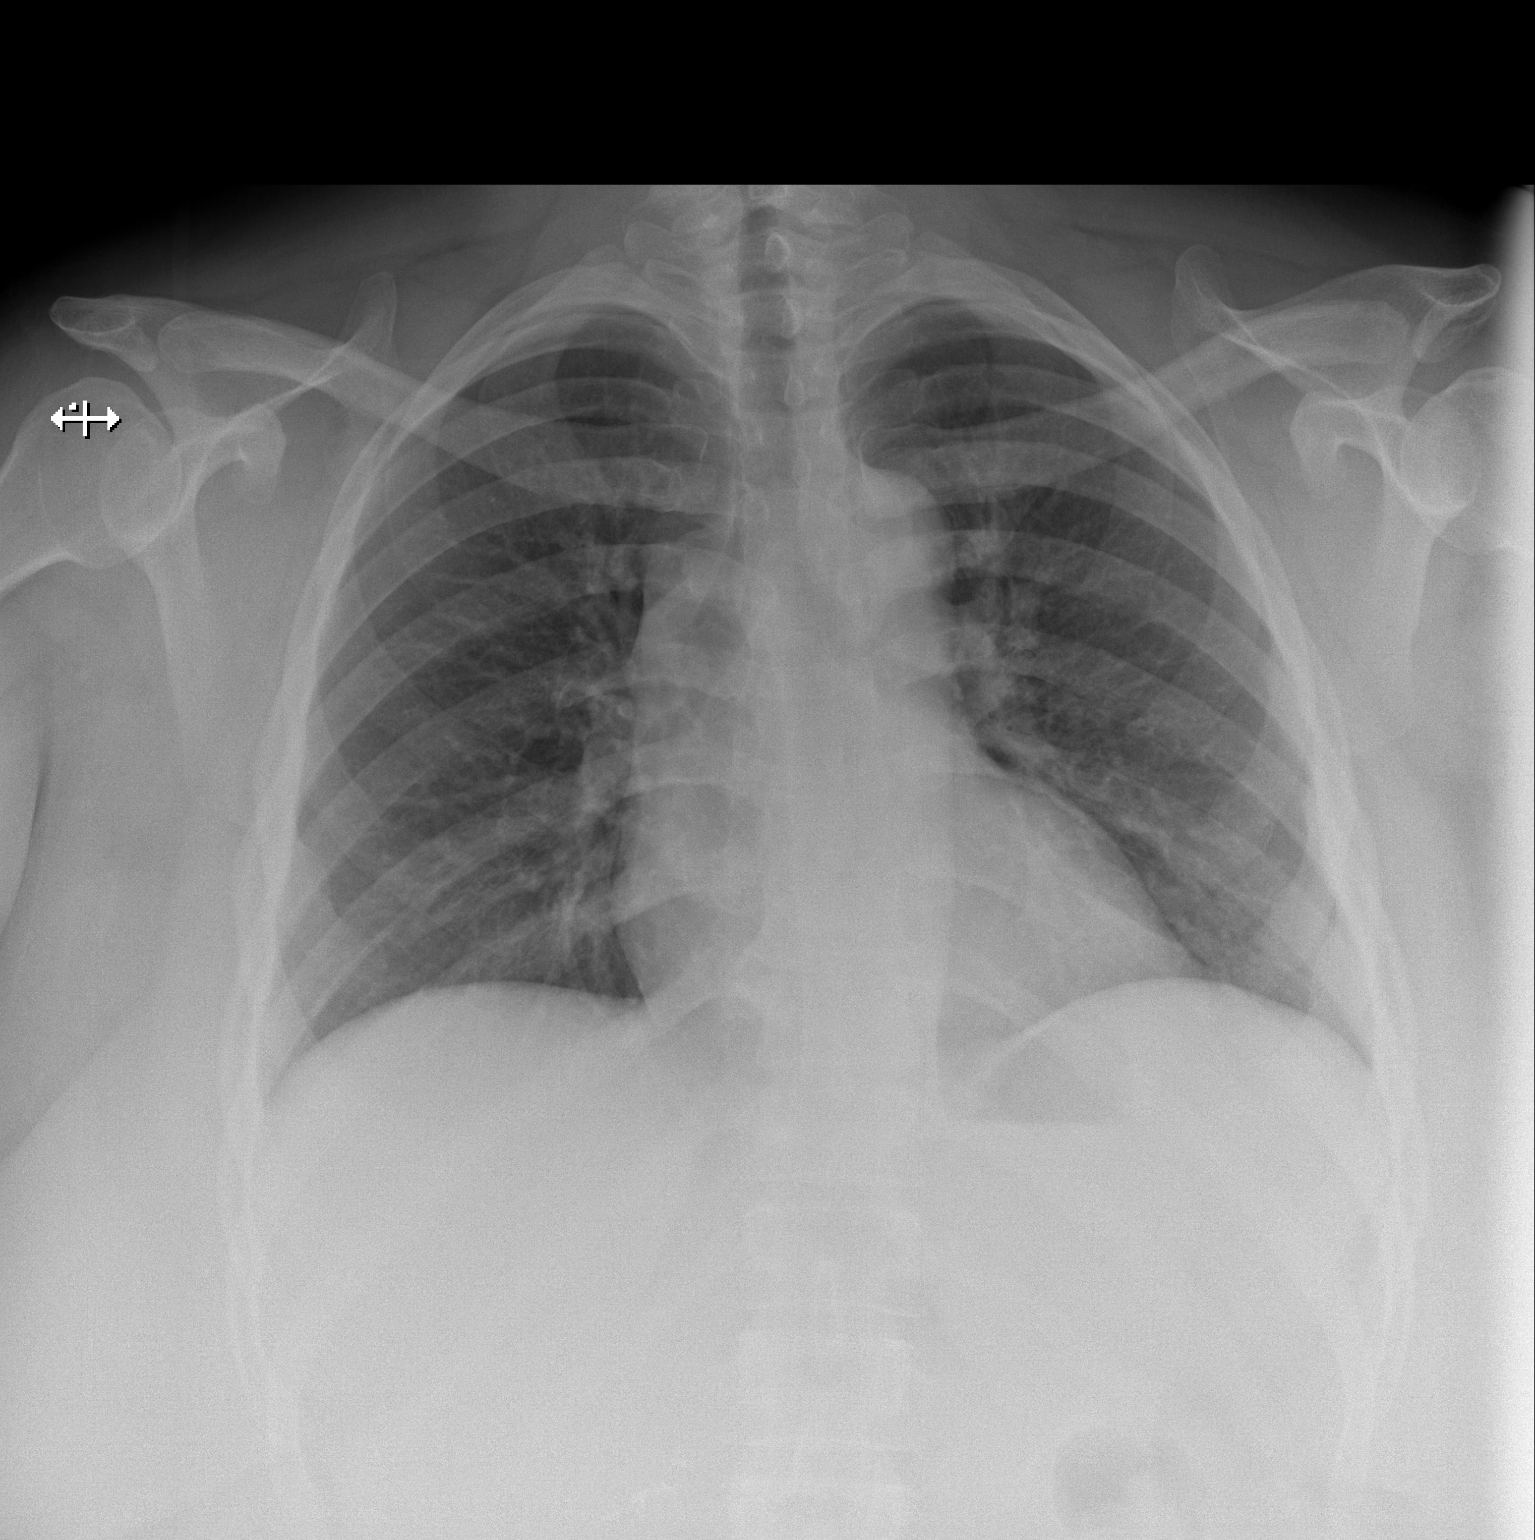

[w chest lat]
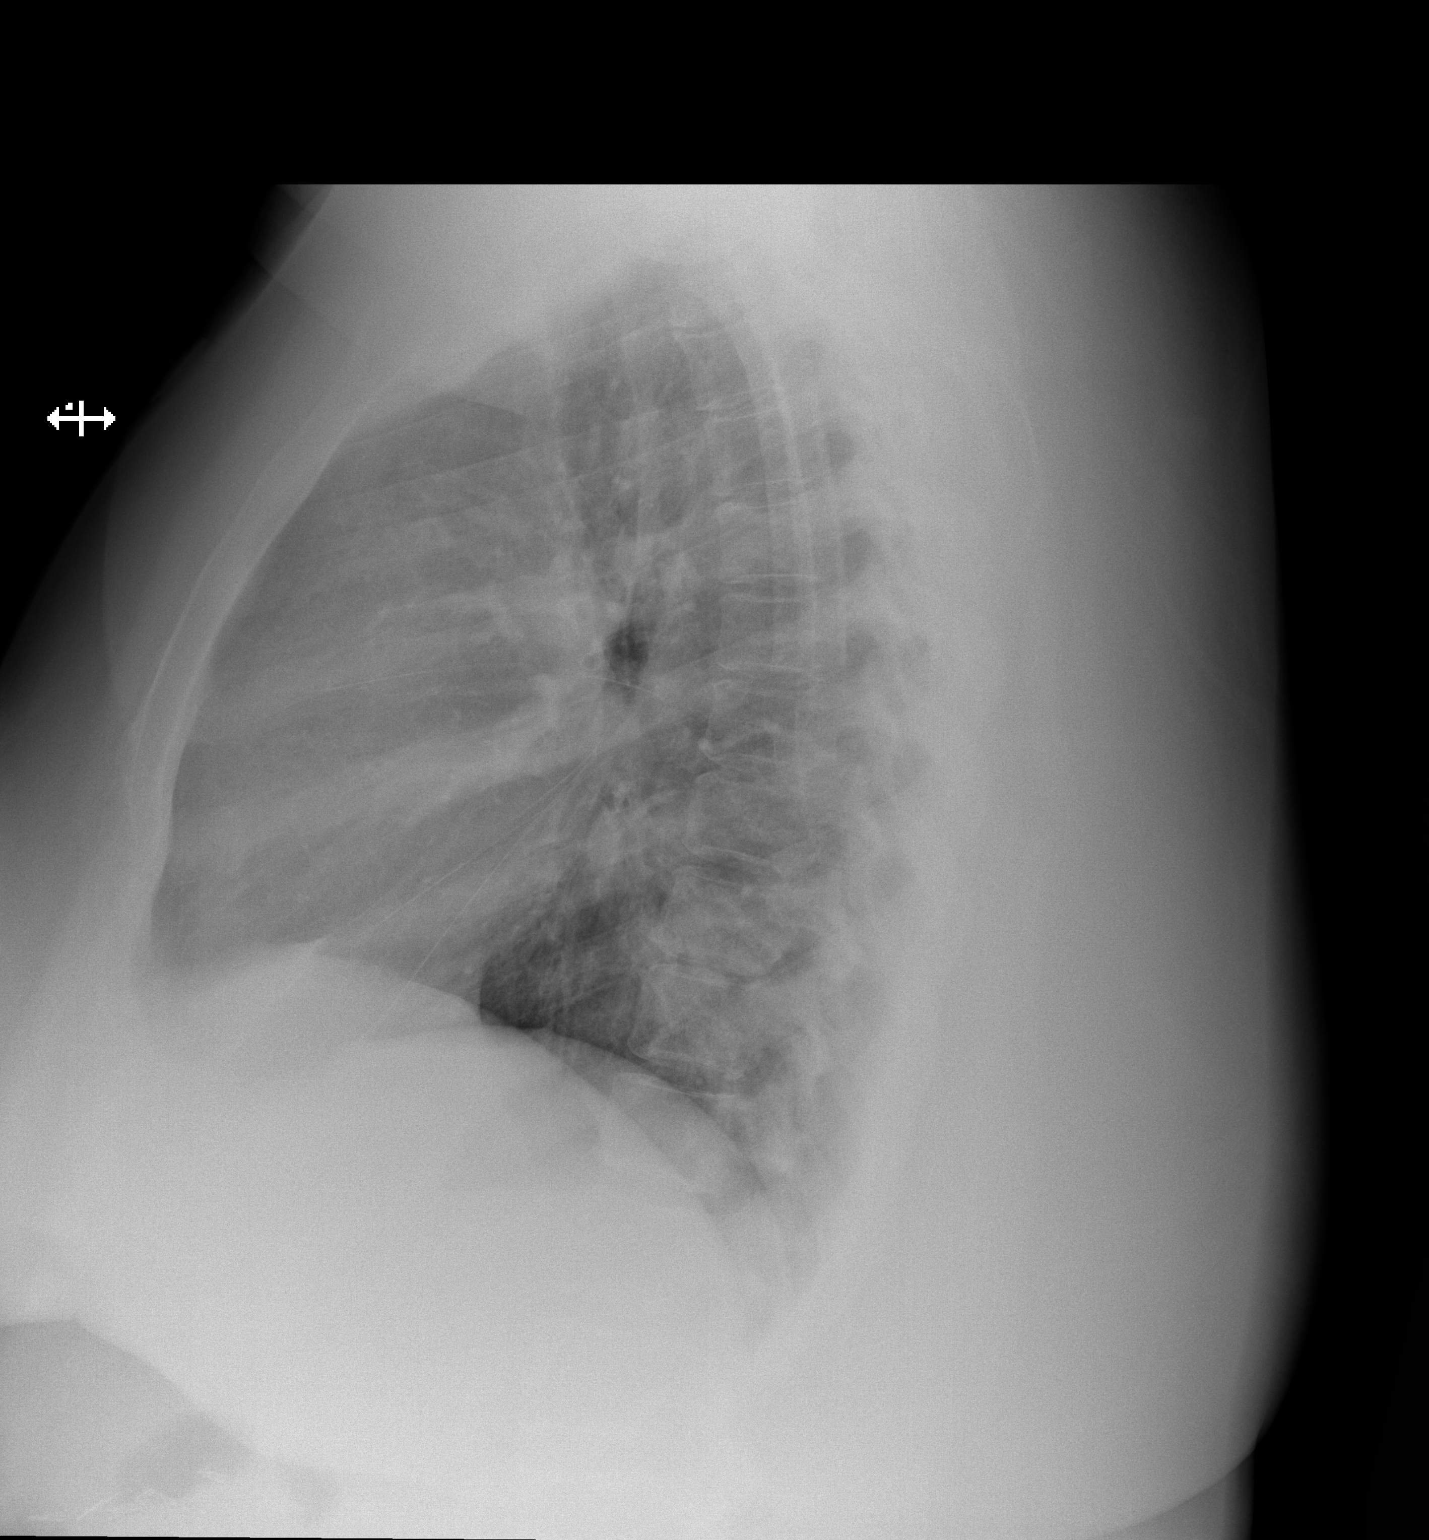

[2 of 2 positions shown; findings below may reference images not displayed]

FINDINGS: Lungs are clear. Heart size and pulmonary vascularity are normal. No
adenopathy. No pneumothorax. No bone lesions.
IMPRESSION: No edema or consolidation.

## 2019-01-21 DIAGNOSIS — E875 Hyperkalemia: Secondary | ICD-10-CM

## 2019-01-21 DIAGNOSIS — R739 Hyperglycemia, unspecified: Secondary | ICD-10-CM

## 2019-01-21 DIAGNOSIS — K219 Gastro-esophageal reflux disease without esophagitis: Secondary | ICD-10-CM

## 2019-01-24 DIAGNOSIS — A4101 Sepsis due to Methicillin susceptible Staphylococcus aureus: Secondary | ICD-10-CM

## 2022-01-09 ENCOUNTER — Other Ambulatory Visit (HOSPITAL_COMMUNITY): Payer: Self-pay

## 2022-01-09 MED ORDER — CYCLOBENZAPRINE HCL 5 MG PO TABS
5.0000 mg | ORAL_TABLET | Freq: Three times a day (TID) | ORAL | 0 refills | Status: AC | PRN
  Filled 2022-01-09: qty 63, 21d supply, fill #0

## 2022-01-09 MED ORDER — OXYCODONE-ACETAMINOPHEN 10-325 MG PO TABS
1.0000 | ORAL_TABLET | Freq: Every day | ORAL | 0 refills | Status: DC | PRN
  Filled 2022-01-09 (×2): qty 150, 30d supply, fill #0

## 2022-01-10 ENCOUNTER — Other Ambulatory Visit (HOSPITAL_COMMUNITY): Payer: Self-pay

## 2022-01-13 ENCOUNTER — Other Ambulatory Visit (HOSPITAL_COMMUNITY): Payer: Self-pay

## 2022-01-13 MED ORDER — OXYCODONE-ACETAMINOPHEN 10-325 MG PO TABS
1.0000 | ORAL_TABLET | Freq: Every day | ORAL | 0 refills | Status: DC | PRN
  Filled 2022-01-13: qty 150, 30d supply, fill #0

## 2022-02-11 ENCOUNTER — Other Ambulatory Visit (HOSPITAL_COMMUNITY): Payer: Self-pay

## 2022-02-11 MED ORDER — CYCLOBENZAPRINE HCL 10 MG PO TABS
10.0000 mg | ORAL_TABLET | Freq: Three times a day (TID) | ORAL | 0 refills | Status: AC | PRN
  Filled 2022-02-11: qty 63, 21d supply, fill #0

## 2022-02-11 MED ORDER — OXYCODONE-ACETAMINOPHEN 10-325 MG PO TABS
1.0000 | ORAL_TABLET | Freq: Every day | ORAL | 0 refills | Status: AC | PRN
  Filled 2022-02-11: qty 150, 30d supply, fill #0

## 2022-08-05 ENCOUNTER — Other Ambulatory Visit (HOSPITAL_COMMUNITY): Payer: Self-pay

## 2022-08-05 MED ORDER — OXYCODONE-ACETAMINOPHEN 10-325 MG PO TABS
1.0000 | ORAL_TABLET | Freq: Every day | ORAL | 0 refills | Status: AC | PRN
  Filled 2022-08-05 – 2022-08-06 (×2): qty 150, 30d supply, fill #0

## 2022-08-05 MED ORDER — CYCLOBENZAPRINE HCL 10 MG PO TABS
10.0000 mg | ORAL_TABLET | Freq: Three times a day (TID) | ORAL | 0 refills | Status: AC | PRN
  Filled 2022-08-05: qty 63, 21d supply, fill #0

## 2022-08-05 MED ORDER — ERGOCALCIFEROL 1.25 MG (50000 UT) PO CAPS
50000.0000 [IU] | ORAL_CAPSULE | ORAL | 1 refills | Status: AC
  Filled 2022-08-05: qty 12, 84d supply, fill #0

## 2022-08-06 ENCOUNTER — Other Ambulatory Visit (HOSPITAL_COMMUNITY): Payer: Self-pay

## 2023-09-22 ENCOUNTER — Ambulatory Visit: Admitting: "Endocrinology

## 2023-10-21 ENCOUNTER — Ambulatory Visit: Admitting: "Endocrinology

## 2023-11-04 ENCOUNTER — Ambulatory Visit: Admitting: "Endocrinology

## 2023-12-03 ENCOUNTER — Ambulatory Visit: Admitting: "Endocrinology
